# Patient Record
Sex: Female | Born: 1990 | Race: White | Hispanic: No | Marital: Single | Smoking: Current every day smoker
Health system: Southern US, Community
[De-identification: ages and names within clinical notes are randomized; demographics above are authoritative.]

## PROBLEM LIST (undated history)

## (undated) DIAGNOSIS — O0990 Supervision of high risk pregnancy, unspecified, unspecified trimester: Secondary | ICD-10-CM

## (undated) DIAGNOSIS — T8859XA Other complications of anesthesia, initial encounter: Secondary | ICD-10-CM

## (undated) DIAGNOSIS — R87619 Unspecified abnormal cytological findings in specimens from cervix uteri: Secondary | ICD-10-CM

## (undated) DIAGNOSIS — T4145XA Adverse effect of unspecified anesthetic, initial encounter: Secondary | ICD-10-CM

## (undated) HISTORY — PX: CHOLECYSTECTOMY: SHX55

## (undated) HISTORY — DX: Unspecified abnormal cytological findings in specimens from cervix uteri: R87.619

## (undated) HISTORY — DX: Supervision of high risk pregnancy, unspecified, unspecified trimester: O09.90

---
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
## 2010-01-05 ENCOUNTER — Emergency Department: Payer: Self-pay | Admitting: Emergency Medicine

## 2010-03-18 IMAGING — US ABDOMEN ULTRASOUND
1 series · 17 of 25 positions shown · non-contrast
Comparison: none

REASON FOR EXAM: ruq/epigastric pain
COMMENTS:

PROCEDURE:     US  - US ABDOMEN GENERAL SURVEY  - March 07, 2009  [DATE]
RESULT:     Abdominal ultrasound dated 03/07/2009.

[Series 1: abdomen ultrasound · 17 of 50 slices shown]
[im 1/50]
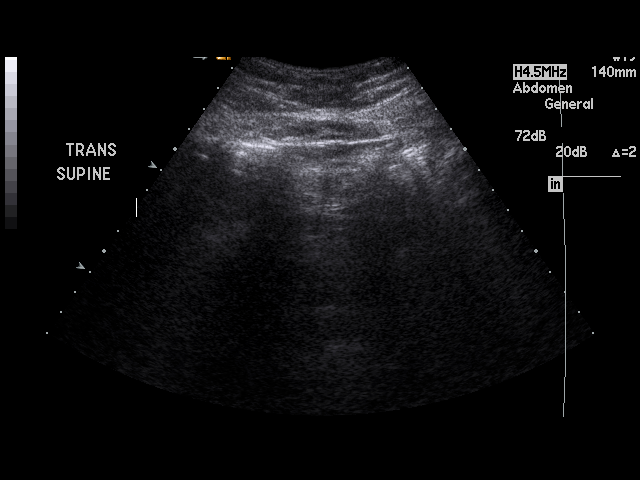
[im 5/50]
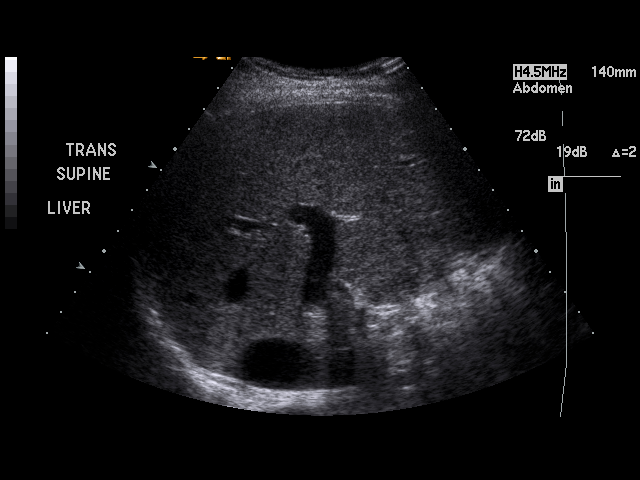
[im 7/50]
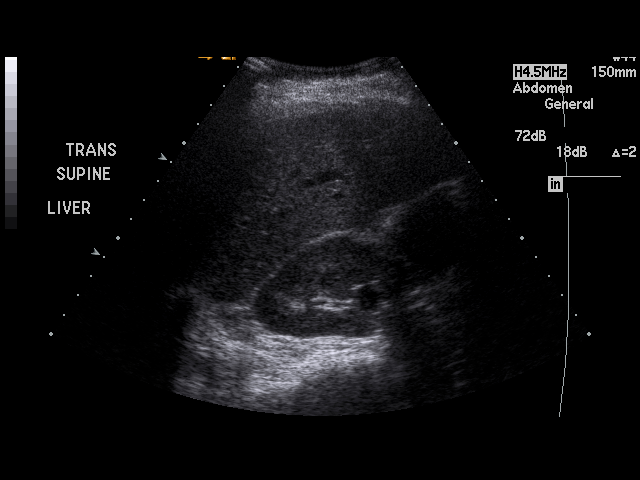
[im 11/50]
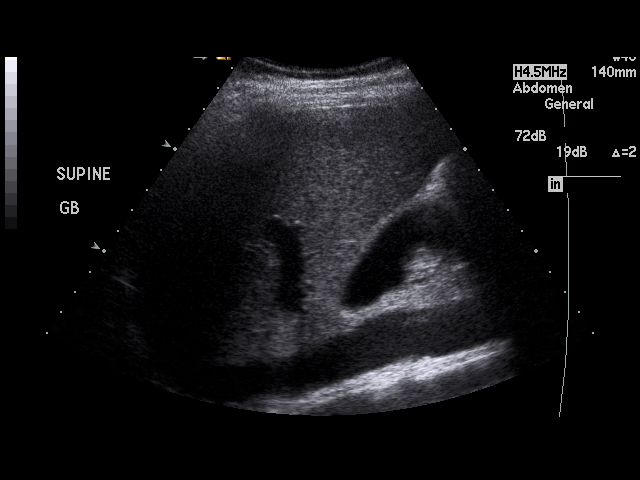
[im 13/50]
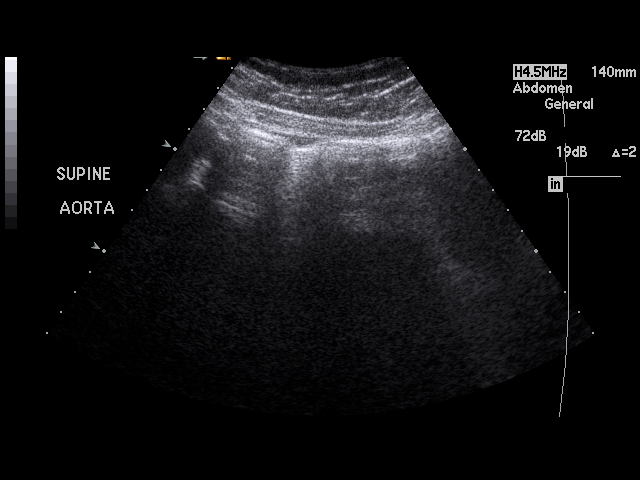
[im 17/50]
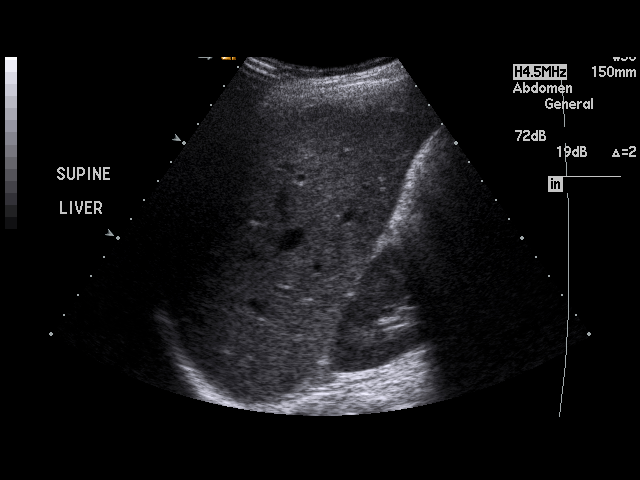
[im 19/50]
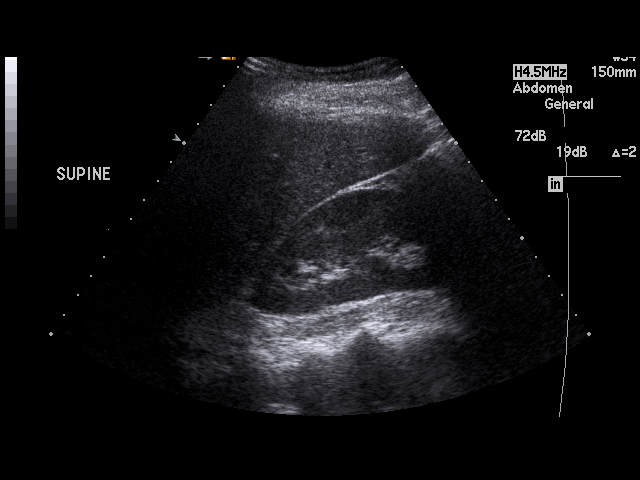
[im 23/50]
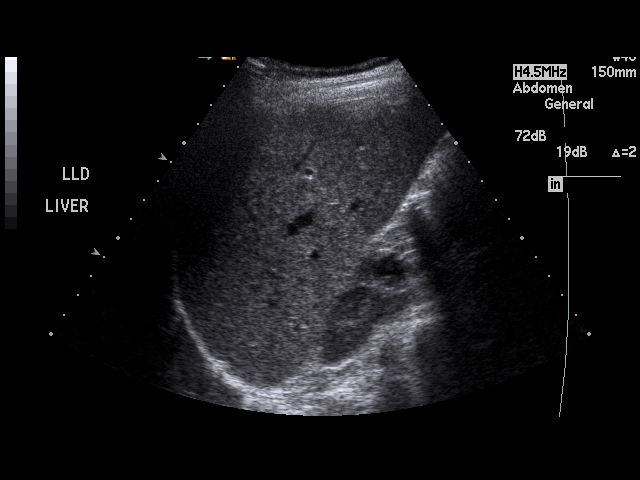
[im 25/50]
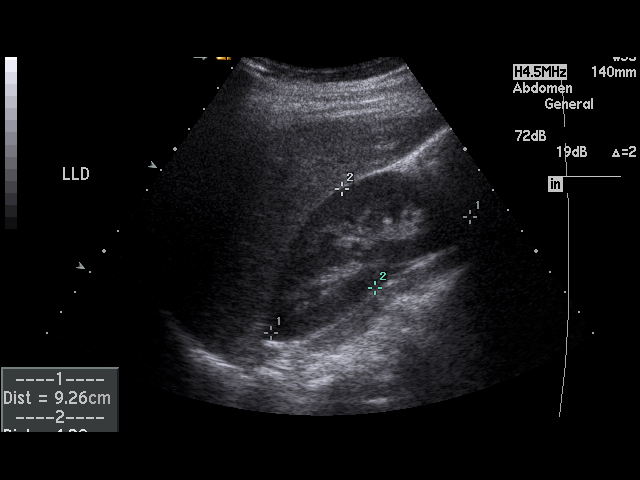
[im 27/50]
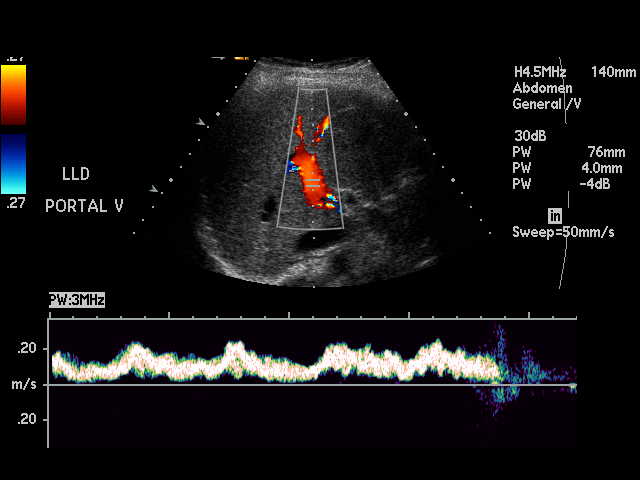
[im 31/50]
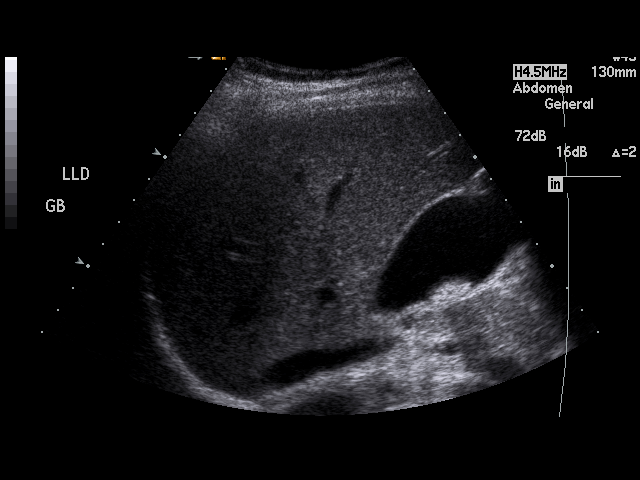
[im 33/50]
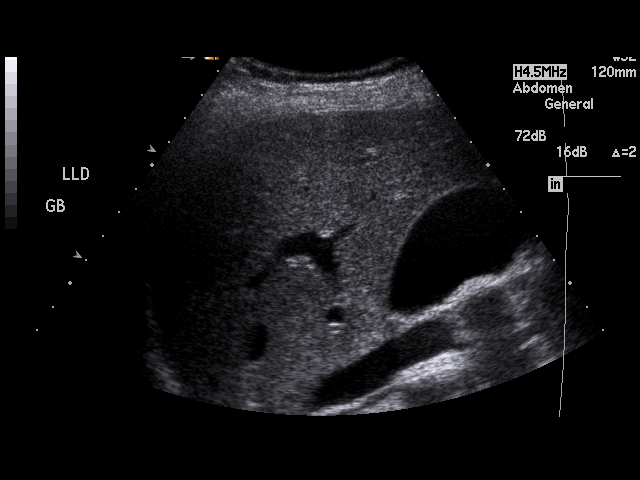
[im 37/50]
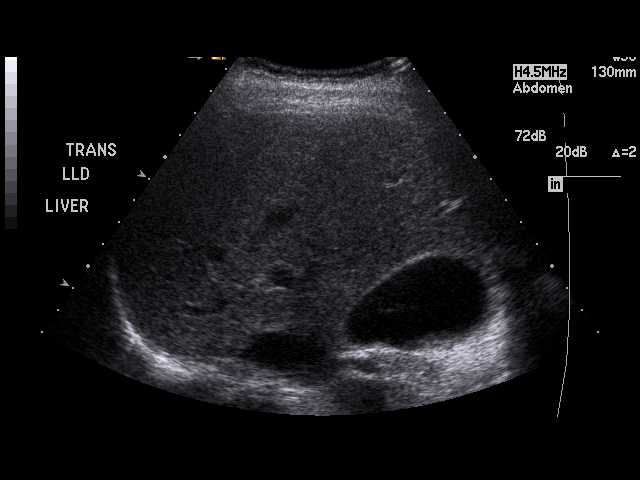
[im 39/50]
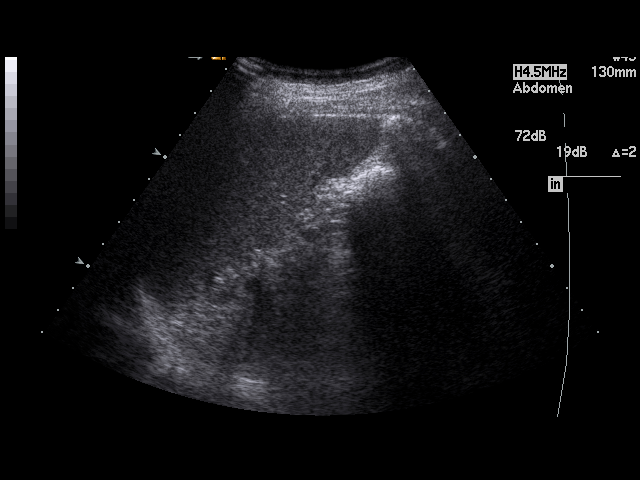
[im 43/50]
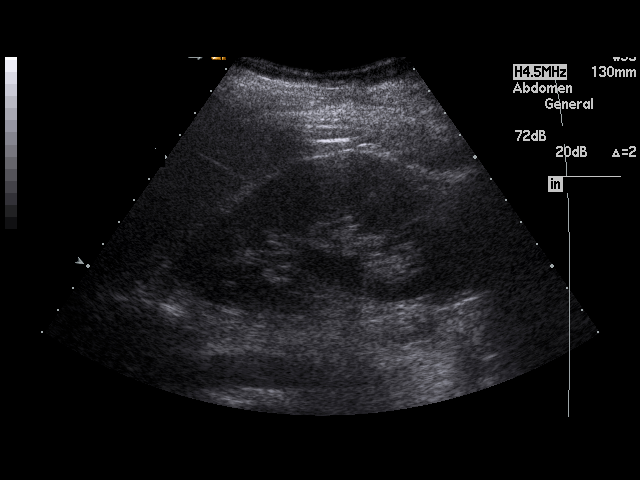
[im 45/50]
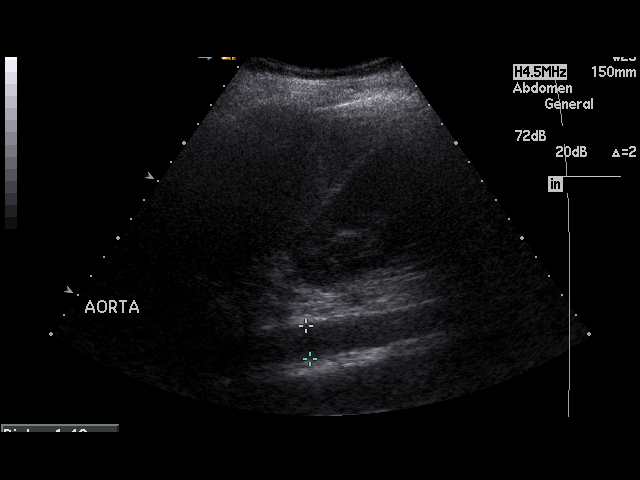
[im 50/50]
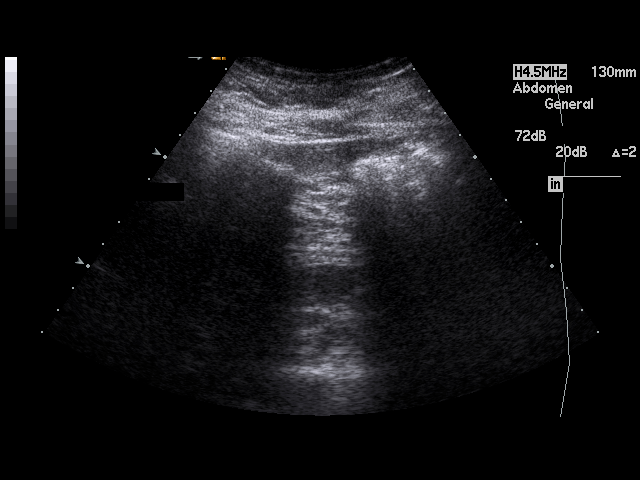

[17 of 25 positions shown; findings below may reference images not displayed]

FINDINGS: The liver demonstrates a homogeneous echotexture. Hepatopedal flow is
demonstrated within the portal vein. The pancreas is nonvisualized. The
aorta and IVC are unremarkable. Evaluation gallbladder fossa demonstrates a
mobile gallstone within the gallbladder without evidence of pericholecystic
fluid, a sonographic Murphy's sign, nor gallbladder wall thickening. The
gallbladder wall measures 1.5 mm in thickness the common bile duct 3.6 mm.
The right kidney measures 9.26 x 4.2 cm and the left 10.8 x 5.25 cm. There
is appropriate cortical medullary differentiation without evidence of
hydronephrosis masses nor calculi.
IMPRESSION: Mobile gallstone within the gallbladder otherwise
unremarkable abdominal ultrasound.

## 2010-11-30 ENCOUNTER — Encounter: Payer: Self-pay | Admitting: Obstetrics and Gynecology

## 2011-02-02 HISTORY — PX: FRACTURE SURGERY: SHX138

## 2011-03-14 ENCOUNTER — Inpatient Hospital Stay: Payer: Self-pay | Admitting: Obstetrics and Gynecology

## 2011-03-14 LAB — URINALYSIS, COMPLETE
Bilirubin,UR: NEGATIVE
Blood: NEGATIVE
Glucose,UR: NEGATIVE mg/dL (ref 0–75)
Ketone: NEGATIVE
WBC UR: 2 /HPF (ref 0–5)

## 2011-03-14 LAB — FETAL FIBRONECTIN: Appearance: NORMAL

## 2011-04-27 ENCOUNTER — Ambulatory Visit: Payer: Self-pay | Admitting: Obstetrics and Gynecology

## 2011-04-27 LAB — CBC WITH DIFFERENTIAL/PLATELET
Basophil %: 0.3 %
Eosinophil #: 0 10*3/uL (ref 0.0–0.7)
Eosinophil %: 0.1 %
HCT: 31.9 % — ABNORMAL LOW (ref 35.0–47.0)
HGB: 10.6 g/dL — ABNORMAL LOW (ref 12.0–16.0)
Lymphocyte #: 2.3 10*3/uL (ref 1.0–3.6)
Lymphocyte %: 21.2 %
MCH: 30.1 pg (ref 26.0–34.0)
MCHC: 33.1 g/dL (ref 32.0–36.0)
MCV: 91 fL (ref 80–100)
Monocyte %: 7.1 %
Neutrophil #: 7.7 10*3/uL — ABNORMAL HIGH (ref 1.4–6.5)
Neutrophil %: 71.3 %
RDW: 13.9 % (ref 11.5–14.5)

## 2011-04-28 ENCOUNTER — Inpatient Hospital Stay: Payer: Self-pay | Admitting: Obstetrics and Gynecology

## 2011-04-29 LAB — HEMATOCRIT: HCT: 30.5 % — ABNORMAL LOW (ref 35.0–47.0)

## 2011-12-09 ENCOUNTER — Ambulatory Visit: Payer: Self-pay | Admitting: Family Medicine

## 2011-12-14 ENCOUNTER — Ambulatory Visit: Payer: Self-pay | Admitting: Podiatry

## 2011-12-14 LAB — CBC WITH DIFFERENTIAL/PLATELET
Eosinophil %: 1.5 %
HCT: 35.8 % (ref 35.0–47.0)
HGB: 12 g/dL (ref 12.0–16.0)
Lymphocyte #: 2.4 10*3/uL (ref 1.0–3.6)
MCH: 31.1 pg (ref 26.0–34.0)
MCV: 93 fL (ref 80–100)
Monocyte #: 0.6 x10 3/mm (ref 0.2–0.9)
Monocyte %: 9.7 %
Neutrophil #: 3.2 10*3/uL (ref 1.4–6.5)
Platelet: 337 10*3/uL (ref 150–440)
RDW: 13.6 % (ref 11.5–14.5)
WBC: 6.3 10*3/uL (ref 3.6–11.0)

## 2011-12-17 ENCOUNTER — Ambulatory Visit: Payer: Self-pay | Admitting: Podiatry

## 2012-04-05 ENCOUNTER — Ambulatory Visit: Payer: Self-pay | Admitting: Family Medicine

## 2012-04-05 LAB — URINALYSIS, COMPLETE
Glucose,UR: NEGATIVE mg/dL (ref 0–75)
Ketone: NEGATIVE
Leukocyte Esterase: NEGATIVE
Nitrite: NEGATIVE
Ph: 6.5 (ref 4.5–8.0)

## 2012-04-05 LAB — PREGNANCY, URINE: Pregnancy Test, Urine: NEGATIVE m[IU]/mL

## 2012-09-05 ENCOUNTER — Ambulatory Visit: Payer: Self-pay | Admitting: Physician Assistant

## 2012-09-05 LAB — URINALYSIS, COMPLETE
Bilirubin,UR: NEGATIVE
Ph: 5 (ref 4.5–8.0)
Protein: 100
RBC,UR: >30 /HPF (ref 0–5)
Specific Gravity: 1.025 (ref 1.003–1.030)

## 2012-09-07 LAB — URINE CULTURE

## 2013-01-03 ENCOUNTER — Emergency Department: Payer: Self-pay | Admitting: Emergency Medicine

## 2014-05-21 NOTE — Op Note (Signed)
PATIENT NAME:  Wendy Bishop, [name] MR#:  [id number] DATE OF BIRTH:  [birth year]  DATE OF PROCEDURE:  12/17/2011  PREOPERATIVE DIAGNOSIS: Fracture, left fifth metatarsal.  POSTOPERATIVE DIAGNOSIS: Fracture, left fifth metatarsal.   PROCEDURE: ORIF left fifth metatarsal fracture.   SURGEON: Linus Galasodd Jillianna Stanek, DPM   ANESTHESIA: Local MAC.   HEMOSTASIS: Pneumatic tourniquet, left ankle, 250 mmHg.   ESTIMATED BLOOD LOSS: Minimal.   MATERIALS: Three 28-gauge monofilament wires double-braided.   COMPLICATIONS: None apparent.   OPERATIVE INDICATIONS: This is a 24 year old female with recent injury to her left foot where she fractured her fifth metatarsal. The patient elects to have surgical ORIF.   OPERATIVE PROCEDURE: The patient was placed on the table in the supine position. Following satisfactory sedation, the left foot was anesthetized with 10 mL of 0.5% Sensorcaine plain. Pneumatic tourniquet was applied at the level of the left ankle and the foot was prepped and draped in the usual sterile fashion. The foot was exsanguinated and the tourniquet inflated to 250 mmHg.   Attention was then directed to the dorsal lateral aspect of the left foot where an approximate 4 cm linear incision was made coursing proximal to distal over the fifth metatarsal. The incision was deepened via sharp and blunt dissection with care taken to cauterize all vessels. A linear periosteal incision was then made along the dorsal fifth metatarsal and using a freer elevator the periosteum was elevated off of the fracture site. The fracture site was then cleaned up using the freer elevator and some hemostats and the wound was flushed with copious amounts of sterile saline. The fracture was then reapproximated and held in place with an Allis clamp. Next, three 28-gauge monofilament wires double-braided back on itself were then inserted around the bone in a cerclage technique and fastened down. Intraoperative FluoroScan views revealed  good reduction of the fracture site and intraoperatively the edges were well coapted. The ends of the monofilament wire were then cut and the remaining end was pressed down against the bone. The wound was flushed with copious amounts of sterile saline and the incision was closed using 4-0 Vicryl running suture for all layers from periosteal deep and superficial subcutaneous to skin closure. Tincture of benzoin and Steri-Strips were then applied across the incision followed by 4 x 4's and a sterile bandage. The tourniquet was released and blood flow noted to return immediately to the left foot in digits one through five. A posterior splint of plaster was then applied to the left foot and leg with the foot 90 degrees relative to the leg. The patient tolerated the procedures and anesthesia well and was transported to the PAC-U with vital signs stable and in good condition.   ____________________________ Linus Galasodd Silviano Neuser, DPM tc:drc D: 12/17/2011 09:21:59 ET T: 12/17/2011 10:30:15 ET JOB#: [id number]  cc: Linus Galasodd Adylee Leonardo, DPM, <Dictator> Gean Larose DPM ELECTRONICALLY SIGNED 12/20/2011 8:57

## 2014-05-26 NOTE — Op Note (Signed)
PATIENT NAME:  Wendy Bishop, Wendy Bishop MR#:  [id number] DATE OF BIRTH:  [birth year]  DATE OF PROCEDURE:  04/28/2011  PREOPERATIVE DIAGNOSIS: Cesarean section prior.   POSTOPERATIVE DIAGNOSIS: Cesarean section prior.   PROCEDURE PERFORMED: Repeat low transverse cesarean section.   SURGEON: Avyana Puffenbarger A. Patton SallesWeaver-Lee, MD  ASSISTANT: Cathlean CowerJamie W., scrub tech.   ANESTHESIA: Spinal.   ESTIMATED BLOOD LOSS: 100 mL.   COMPLICATIONS: None.   FINDINGS: Vertex female infant, 3010 grams, Apgars 9 and 9, adhesions of anterior uterus to peritoneum, very thin lower uterine segment, normal tubes and ovaries.   SPECIMENS: None.   INDICATIONS: Patient is a 24 year old who presents for repeat cesarean section. Risks, benefits and alternatives of the procedure were explained and informed consent was obtained.   DESCRIPTION OF PROCEDURE: Patient was taken to the Operating Room with IV fluids running. She was prepped and draped in the usual sterile fashion in leftward tilt. A Pfannenstiel skin incision was made, carried down to underlying fascia with the knife. The fascia was nicked in the midline. The incision was extended laterally. The superior aspect of the fascia was grasped with Kocher clamps and the underlying rectus muscles were dissected off. This was repeated on the inferior fascia. The rectus muscles were divided in the midline. The peritoneum was entered sharply and the opening was extended. The bladder blade was placed. The vesicouterine peritoneum was located with some difficulty secondary to prior adhesions. The bladder flap was created digitally. The bladder blade was replaced. The hysterotomy incision was made with just two swipes blade as the lower uterine segment was very thin. The incision was extended bluntly. The infant's head was grasped. The membranes were ruptured and the infant's head was delivered atraumatically through the hysterotomy incision. The mouth and nose were bulb suctioned. The anterior and  posterior shoulder were delivered followed by remainder of the body. The cord was clamped x2 and cut. The infant was handed to the awaiting neonatologist. The placenta was expressed. The uterus was partly exteriorized secondary to adhesions of the left anterior uterus to the peritoneum and cleared of all clot and debris. The hysterotomy incision was repaired with #0 Monocryl in running locked fashion. The uterus was returned to the abdomen. The abdomen and gutters were irrigated with copious amounts of warm normal saline. A piece Interceed was placed over the hysterotomy incision. The peritoneum was repaired with 2-0 Vicryl in a running fashion. The On-Q pump was placed according to manufacturer's instructions. The fascia was repaired with a #1 PDS. The skin was closed with staples. Patient tolerated procedure well. The On-Q pump tubing was secured using Steri-Strips and Tegaderm. Sponge, needle and instrument counts were correct x2. The patient was taken to the recovery room in stable condition.    ____________________________ Sonda PrimesLashawn A. Patton SallesWeaver-Lee, MD law:cms D: 04/28/2011 09:24:27 ET T: 04/28/2011 11:03:30 ET JOB#: [id number]  cc: Flint MelterLashawn A. Patton SallesWeaver-Lee, MD, <Dictator> Janyth ContesLASHAWN A WEAVER LEE MD ELECTRONICALLY SIGNED 05/15/2011 10:34

## 2014-06-11 NOTE — H&P (Signed)
L&D Evaluation:  History:   HPI 24 yo G3P1101 @ 3283w0d by 15 week u/s.  Pregnancy complicated by history of delivery at 24 weeks secondary to suspected placental abruption. There was subsequent fetal demise.  For G2 she went on to deliver at term, but by cesarean section due to fetal distress. Her pregnancy has also been complicated by smoking and an abnormal pap smear. She presents with abdominal pain and lower back pain that is constant.  This happened after an episode of exertion about 1.5 hours prior to presentation.  She notes +FM, no LOF, no VB.  A+, RI, VZI, RPR NR, HIV neg.    Presents with abdominal pain, back pain, contractions    Patient's Medical History No Chronic Illness    Patient's Surgical History Previous C-Section  Cholecystectomy    Medications Pre Natal Vitamins    Allergies NKDA    Social History tobacco    Family History Non-Contributory   ROS:   ROS All systems were reviewed.  HEENT, CNS, GI, GU, Respiratory, CV, Renal and Musculoskeletal systems were found to be normal., Denies urinary symptoms, denies vaginal symptoms   Exam:   Vital Signs stable  AFVSS    General no apparent distress    Mental Status clear    Chest clear    Heart normal sinus rhythm    Abdomen gravid, mildly tender to palpation in midline, mild ttp over right aspect of previous pfannenstiel incision    Estimated Fetal Weight Average for gestational age, 2129g = 4#11oz    Fetal Position cephalic    Back no CVAT    Edema no edema    Pelvic no external lesions, 1/20/-3    Mebranes Intact    FHT normal rate with no decels    FHT Description 135/mod var/+accels/no decels    Ucx regular, 3-4 ctx q 10 mins    Ucx Frequency 3 min    Skin no lesions    Lymph no lymphadenopathy    Other Wet Mount: neg KOH: no fungal elements fFN = negative   Impression:   Impression eval for labor   Plan:   Plan UA, EFM/NST, monitor contractions and for cervical change, fluids     Comments fFN negative send Aptima send UA admit and give BMTZ and will tocolyze with nifedipine if labors, will go to OR for repeat cesarean section.   Electronic Signatures: Conard NovakJackson, Jaide Hillenburg D (MD)  (Signed 10-Feb-13 20:58)  Authored: L&D Evaluation   Last Updated: 10-Feb-13 20:58 by Conard NovakJackson, Jaasia Viglione D (MD)

## 2015-09-30 ENCOUNTER — Encounter: Payer: Self-pay | Admitting: Emergency Medicine

## 2015-09-30 ENCOUNTER — Emergency Department
Admission: EM | Admit: 2015-09-30 | Discharge: 2015-09-30 | Disposition: A | Discharge status: Home / Self Care | Payer: Medicaid Other | Attending: Emergency Medicine | Admitting: Emergency Medicine

## 2015-09-30 DIAGNOSIS — Z3A08 8 weeks gestation of pregnancy: Secondary | ICD-10-CM | POA: Diagnosis not present

## 2015-09-30 DIAGNOSIS — Y999 Unspecified external cause status: Secondary | ICD-10-CM | POA: Diagnosis not present

## 2015-09-30 DIAGNOSIS — Z3401 Encounter for supervision of normal first pregnancy, first trimester: Secondary | ICD-10-CM | POA: Insufficient documentation

## 2015-09-30 DIAGNOSIS — F17200 Nicotine dependence, unspecified, uncomplicated: Secondary | ICD-10-CM | POA: Insufficient documentation

## 2015-09-30 DIAGNOSIS — Y9389 Activity, other specified: Secondary | ICD-10-CM | POA: Diagnosis not present

## 2015-09-30 DIAGNOSIS — Y929 Unspecified place or not applicable: Secondary | ICD-10-CM | POA: Insufficient documentation
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
## 2015-09-30 NOTE — Discharge Instructions (Signed)
Return to the ER for difficulty breathing, abdominal pain, vaginal bleeding, pelvic pain, numbness or weakness in her arms or legs, or for any other concerns.
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
## 2015-10-10 LAB — OB RESULTS CONSOLE ABO/RH: RH TYPE: POSITIVE

## 2015-10-10 LAB — OB RESULTS CONSOLE RPR: RPR: NONREACTIVE

## 2015-10-10 LAB — OB RESULTS CONSOLE ANTIBODY SCREEN: Antibody Screen: NEGATIVE

## 2015-10-10 LAB — OB RESULTS CONSOLE HEPATITIS B SURFACE ANTIGEN: Hepatitis B Surface Ag: NEGATIVE

## 2015-10-10 LAB — OB RESULTS CONSOLE HGB/HCT, BLOOD
HEMATOCRIT: 38 %
Hemoglobin: 13.2 g/dL

## 2015-10-10 LAB — OB RESULTS CONSOLE PLATELET COUNT: PLATELETS: 392 10*3/uL

## 2015-10-10 LAB — OB RESULTS CONSOLE RUBELLA ANTIBODY, IGM: RUBELLA: IMMUNE

## 2015-10-10 LAB — OB RESULTS CONSOLE HIV ANTIBODY (ROUTINE TESTING): HIV: NONREACTIVE

## 2015-10-10 LAB — OB RESULTS CONSOLE VARICELLA ZOSTER ANTIBODY, IGG: Varicella: IMMUNE

## 2015-10-10 LAB — OB RESULTS CONSOLE GC/CHLAMYDIA
CHLAMYDIA, DNA PROBE: NEGATIVE
Gonorrhea: NEGATIVE

## 2015-11-10 ENCOUNTER — Emergency Department
Admission: EM | Admit: 2015-11-10 | Discharge: 2015-11-10 | Disposition: A | Discharge status: Home / Self Care | Payer: Medicaid Other | Attending: Emergency Medicine | Admitting: Emergency Medicine

## 2015-11-10 ENCOUNTER — Emergency Department: Payer: Medicaid Other

## 2015-11-10 ENCOUNTER — Encounter: Payer: Self-pay | Admitting: Emergency Medicine

## 2015-11-10 DIAGNOSIS — N9489 Other specified conditions associated with female genital organs and menstrual cycle: Secondary | ICD-10-CM | POA: Diagnosis not present

## 2015-11-10 DIAGNOSIS — Z79899 Other long term (current) drug therapy: Secondary | ICD-10-CM | POA: Diagnosis not present

## 2015-11-10 DIAGNOSIS — O209 Hemorrhage in early pregnancy, unspecified: Secondary | ICD-10-CM | POA: Diagnosis not present

## 2015-11-10 DIAGNOSIS — O4690 Antepartum hemorrhage, unspecified, unspecified trimester: Secondary | ICD-10-CM

## 2015-11-10 DIAGNOSIS — F17200 Nicotine dependence, unspecified, uncomplicated: Secondary | ICD-10-CM | POA: Insufficient documentation

## 2015-11-10 DIAGNOSIS — Z3A13 13 weeks gestation of pregnancy: Secondary | ICD-10-CM | POA: Insufficient documentation

## 2015-11-10 DIAGNOSIS — O99331 Smoking (tobacco) complicating pregnancy, first trimester: Secondary | ICD-10-CM | POA: Insufficient documentation

## 2015-11-10 LAB — CBC WITH DIFFERENTIAL/PLATELET
BASOS ABS: 0 10*3/uL (ref 0–0.1)
BLASTS: 0 %
Band Neutrophils: 0 %
Basophils Relative: 0 %
Eosinophils Absolute: 0.3 10*3/uL (ref 0–0.7)
Eosinophils Relative: 3 %
HEMATOCRIT: 35 % (ref 35.0–47.0)
Hemoglobin: 12.4 g/dL (ref 12.0–16.0)
Lymphocytes Relative: 24 %
Lymphs Abs: 2.4 10*3/uL (ref 1.0–3.6)
MCH: 31.8 pg (ref 26.0–34.0)
MCHC: 35.5 g/dL (ref 32.0–36.0)
MCV: 89.7 fL (ref 80.0–100.0)
METAMYELOCYTES PCT: 0 %
MONOS PCT: 4 %
MYELOCYTES: 0 %
Monocytes Absolute: 0.4 10*3/uL (ref 0.2–0.9)
NEUTROS ABS: 6.8 10*3/uL — AB (ref 1.4–6.5)
NEUTROS PCT: 69 %
Other: 0 %
Platelets: 317 10*3/uL (ref 150–440)
Promyelocytes Absolute: 0 %
RBC: 3.9 MIL/uL (ref 3.80–5.20)
RDW: 13.2 % (ref 11.5–14.5)
WBC: 9.9 10*3/uL (ref 3.6–11.0)
nRBC: 0 /100 WBC

## 2015-11-10 LAB — POCT PREGNANCY, URINE: Preg Test, Ur: POSITIVE — AB

## 2015-11-10 LAB — HCG, QUANTITATIVE, PREGNANCY: hCG, Beta Chain, Quant, S: 103737 m[IU]/mL — ABNORMAL HIGH (ref <5)

## 2015-11-10 NOTE — ED Notes (Signed)
Patient transported to US 

## 2015-11-10 NOTE — ED Provider Notes (Signed)
Time Seen: Approximately 0741  I have reviewed the triage notes  Chief Complaint: Vaginal Bleeding   History of Present Illness: Wendy Bishop is a 25 y.o. female *who is currently gravida 4 para 2 with one previous 24 week stillbirth. The patient states that she also had a cholecystectomy around her second pregnancy. She states she is currently approximately [redacted] weeks pregnant with no complications up to this point. She's had some morning sickness and at times hyperemesis gravidarum. She states that seems to be resolving. Patient has an appointment today at noon with her OB/GYN but was concerned this morning due to some small amount of vaginal bleeding. She states she's had a previous ultrasound approximately 2 weeks ago and everything was proceeding normally at that time. She denies any fever or urinary complaints. She denies any severe cramping or pain in the pelvic area. History reviewed. No pertinent past medical history.  There are no active problems to display for this patient.   History reviewed. No pertinent surgical history.  History reviewed. No pertinent surgical history.    Allergies:  Review of patient's allergies indicates no known allergies.  Family History: No family history on file.  Social History: Social History  Substance Use Topics  . Smoking status: Current Every Day Smoker  . Smokeless tobacco: Never Used  . Alcohol use No     Review of Systems:   10 point review of systems was performed and was otherwise negative:  Constitutional: No fever Eyes: No visual disturbances ENT: No sore throat, ear pain Cardiac: No chest pain Respiratory: No shortness of breath, wheezing, or stridor Abdomen: No abdominal pain, no vomiting, No diarrhea Endocrine: No weight loss, No night sweats Extremities: No peripheral edema, cyanosis Skin: No rashes, easy bruising Neurologic: No focal weakness, trouble with speech or swollowing Urologic: No dysuria,  Hematuria, or urinary frequency   Physical Exam:  ED Triage Vitals  Enc Vitals Group     BP 11/10/15 0722 (!) 118/57     Pulse Rate 11/10/15 0722 83     Resp 11/10/15 0722 18     Temp 11/10/15 0722 98.1 F (36.7 C)     Temp Source 11/10/15 0722 Oral     SpO2 11/10/15 0722 100 %     Weight 11/10/15 0717 194 lb (88 kg)     Height --      Head Circumference --      Peak Flow --      Pain Score --      Pain Loc --      Pain Edu? --      Excl. in GC? --     General: Awake , Alert , and Oriented times 3; GCS 15 Head: Normal cephalic , atraumatic Eyes: Pupils equal , round, reactive to light Nose/Throat: No nasal drainage, patent upper airway without erythema or exudate.  Neck: Supple, Full range of motion, No anterior adenopathy or palpable thyroid masses Lungs: Clear to ascultation without wheezes , rhonchi, or rales Heart: Regular rate, regular rhythm without murmurs , gallops , or rubs Abdomen: Soft, non tender without rebound, guarding , or rigidity; bowel sounds positive and symmetric in all 4 quadrants. No organomegaly .        Extremities: 2 plus symmetric pulses. No edema, clubbing or cyanosis Neurologic: normal ambulation, Motor symmetric without deficits, sensory intact Skin: warm, dry, no rashes Pelvic exam was deferred for ultrasound evaluation  Labs:   All laboratory work was reviewed including any pertinent  negatives or positives listed below:  Labs Reviewed  CBC WITH DIFFERENTIAL/PLATELET  HCG, QUANTITATIVE, PREGNANCY  Laboratory work was reviewed and showed no clinically significant abnormalities.   Radiology:  "US Ob Comp Less 14 Wks  Result Date: 11/10/2015 CLINICAL DATA:  25 year old female [place] with vaginal bleeding in the first trimester of pregnancy. Estimated gestational age by a first ultrasound 13 weeks 3 days. Quantitative beta HCG pending. Initial encounter. EXAM: OBSTETRIC <14 WK ULTRASOUND TECHNIQUE: Transabdominal ultrasound was performed for  evaluation of the gestation as well as the maternal uterus and adnexal regions. COMPARISON:  None relevant, 11/30/2010 Ob ultrasound FINDINGS: Intrauterine gestational sac: Single Yolk sac:  Not visible Embryo:  Visible Cardiac Activity: Detected Heart Rate: 153 bpm CRL:   74.8  mm   13 w 4 d                  [place] EDC: 05/13/2016 Subchorionic hemorrhage:  None visualized. Maternal uterus/adnexae: The left ovary appears normal measuring 1.7 x 2.4 x 3.3 cm. The right ovary is not visualized. No pelvic free fluid. IMPRESSION: Single living IUP demonstrated. No acute maternal findings visualized. Electronically Signed   By: Odessa Fleming M.D.   On: 11/10/2015 08:46  " I personally reviewed the radiologic studies   ED Course:  Patient's stay was uneventful and appears that her pregnancy is proceeding normally. She was advised to continue with her follow-up with her OB/GYN and informed them of her visit and laboratory work and ultrasound etc. Clinical Course     Assessment: First trimester vaginal bleeding   F   Plan: * Outpatient Patient was advised to return immediately if condition worsens. Patient was advised to follow up with their primary care physician or other specialized physicians involved in their outpatient care. The patient and/or family member/power of attorney had laboratory results reviewed at the bedside. All questions and concerns were addressed and appropriate discharge instructions were distributed by the nursing staff.             Jennye Moccasin, MD 11/10/15 [phone]

## 2015-11-10 NOTE — ED Triage Notes (Signed)
Pt with vaginal bleeding, states she is three mths pregnant.

## 2015-11-10 NOTE — ED Notes (Signed)
Pt stating that she is about 3 and a half months pregnant. Pt stating that she noticed some pinkish blood when she wiped after urination. Pt denying any abdominal pain. Pt is stating that she has an appointment with her OB/GYN today at noon. Pt denying any other blood or spotting since the initial spot with cleaning herself earlier.

## 2015-11-10 NOTE — ED Notes (Signed)
US called with positive pregnancy. US aware that pt is ready for test.

## 2015-11-10 NOTE — Discharge Instructions (Signed)
Please return immediately if condition worsens. Please contact her primary physician or the physician you were given for referral. If you have any specialist physicians involved in her treatment and plan please also contact them. Thank you for using [facility]. ° °

## 2015-11-10 NOTE — ED Notes (Signed)
Pt returned from US

## 2016-03-31 ENCOUNTER — Telehealth: Payer: Self-pay | Admitting: Obstetrics and Gynecology

## 2016-03-31 NOTE — Telephone Encounter (Signed)
Pt is calling due to missed call and needing an update  on surgical referral in [place]

## 2016-04-05 ENCOUNTER — Ambulatory Visit (INDEPENDENT_AMBULATORY_CARE_PROVIDER_SITE_OTHER): Payer: Medicaid Other | Admitting: Certified Nurse Midwife

## 2016-04-05 VITALS — BP 102/62 | Wt 186.0 lb

## 2016-04-05 DIAGNOSIS — O0993 Supervision of high risk pregnancy, unspecified, third trimester: Secondary | ICD-10-CM

## 2016-04-05 DIAGNOSIS — O09212 Supervision of pregnancy with history of pre-term labor, second trimester: Secondary | ICD-10-CM

## 2016-04-05 DIAGNOSIS — Z720 Tobacco use: Secondary | ICD-10-CM

## 2016-04-05 DIAGNOSIS — Z3A34 34 weeks gestation of pregnancy: Secondary | ICD-10-CM

## 2016-04-05 DIAGNOSIS — O34219 Maternal care for unspecified type scar from previous cesarean delivery: Secondary | ICD-10-CM

## 2016-04-05 NOTE — Progress Notes (Signed)
Pt reports no problems. FKC's given.  

## 2016-04-06 ENCOUNTER — Other Ambulatory Visit: Payer: Self-pay | Admitting: Obstetrics & Gynecology

## 2016-04-06 DIAGNOSIS — Z8751 Personal history of pre-term labor: Secondary | ICD-10-CM

## 2016-04-07 DIAGNOSIS — Z720 Tobacco use: Secondary | ICD-10-CM | POA: Insufficient documentation

## 2016-04-07 DIAGNOSIS — F17200 Nicotine dependence, unspecified, uncomplicated: Secondary | ICD-10-CM | POA: Insufficient documentation

## 2016-04-07 DIAGNOSIS — O0993 Supervision of high risk pregnancy, unspecified, third trimester: Secondary | ICD-10-CM | POA: Insufficient documentation

## 2016-04-07 DIAGNOSIS — O34219 Maternal care for unspecified type scar from previous cesarean delivery: Secondary | ICD-10-CM | POA: Insufficient documentation

## 2016-04-07 DIAGNOSIS — O09212 Supervision of pregnancy with history of pre-term labor, second trimester: Secondary | ICD-10-CM | POA: Insufficient documentation

## 2016-04-07 NOTE — Progress Notes (Signed)
Doing well  active. FHTs WNL.  Will be bottle feeding/ BTL ( papers signed) Repeat CS and BTL scheduled for 05/11/2016 Still smoking 1/2 PPD. Given FKC instructions ROB and GBS in 2 weeks

## 2016-04-08 ENCOUNTER — Telehealth: Payer: Self-pay

## 2016-04-08 NOTE — Telephone Encounter (Signed)
Pt would like to know what to do about low abdominal pressure that she is experiencing per chart she is high risk pregnancy.

## 2016-04-08 NOTE — Telephone Encounter (Signed)
(  Pt 7225w6d, history of 24 week delivery)  Pt states the vaginal pressure has eased up since she has been sitting down some states she is about to get off work. Pt aware the pressure may have been from being on her feet for so long since the pressure has gotten better since resting. Please advise, if I should let her know anything else.

## 2016-04-08 NOTE — Telephone Encounter (Signed)
Left voice message for pt to let us know if she wanted to be seen sooner than her next appt.

## 2016-04-08 NOTE — Telephone Encounter (Signed)
Can check her next visit, and can see her sooner if necessary as a work in with any OB provider (not ABC) or overbooking one of us

## 2016-04-13 ENCOUNTER — Telehealth: Payer: Self-pay

## 2016-04-13 DIAGNOSIS — Z20828 Contact with and (suspected) exposure to other viral communicable diseases: Secondary | ICD-10-CM

## 2016-04-13 MED ORDER — OSELTAMIVIR PHOSPHATE 75 MG PO CAPS: 75.0000 mg | ORAL_CAPSULE | Freq: Every day | ORAL | 0 refills | Status: AC

## 2016-04-13 NOTE — Telephone Encounter (Signed)
Please advise 

## 2016-04-13 NOTE — Telephone Encounter (Signed)
Pt states her daughter was diagnosed with the flu today and she is requesting rx for tamiflu. Cb# [phone]. Thank you.  Please make sure SDJ receives message for today.

## 2016-04-14 NOTE — Telephone Encounter (Signed)
rx sent to pharmacy by SDJ on 04/13/16.

## 2016-04-20 ENCOUNTER — Encounter: Payer: Self-pay | Admitting: Advanced Practice Midwife

## 2016-04-20 ENCOUNTER — Ambulatory Visit (INDEPENDENT_AMBULATORY_CARE_PROVIDER_SITE_OTHER): Payer: Medicaid Other

## 2016-04-20 ENCOUNTER — Ambulatory Visit (INDEPENDENT_AMBULATORY_CARE_PROVIDER_SITE_OTHER): Payer: Medicaid Other | Admitting: Advanced Practice Midwife

## 2016-04-20 VITALS — BP 110/70 | Wt 188.0 lb

## 2016-04-20 DIAGNOSIS — Z3685 Encounter for antenatal screening for Streptococcus B: Secondary | ICD-10-CM

## 2016-04-20 DIAGNOSIS — Z113 Encounter for screening for infections with a predominantly sexual mode of transmission: Secondary | ICD-10-CM

## 2016-04-20 DIAGNOSIS — Z3A36 36 weeks gestation of pregnancy: Secondary | ICD-10-CM

## 2016-04-20 DIAGNOSIS — Z8751 Personal history of pre-term labor: Secondary | ICD-10-CM | POA: Diagnosis not present

## 2016-04-20 NOTE — Progress Notes (Signed)
Growth scan today. Baby at 6 pounds 4 ounces, 40%, AFI 12cm. Requests out of work letter today. Repeat C/S scheduled.

## 2016-04-20 NOTE — Progress Notes (Signed)
GBS/Aptima today 

## 2016-04-22 LAB — GC/CHLAMYDIA PROBE AMP
CHLAMYDIA, DNA PROBE: NEGATIVE
NEISSERIA GONORRHOEAE BY PCR: NEGATIVE

## 2016-04-22 LAB — STREP GP B NAA: STREP GROUP B AG: NEGATIVE

## 2016-04-27 ENCOUNTER — Encounter: Payer: Medicaid Other | Admitting: Obstetrics and Gynecology

## 2016-04-28 ENCOUNTER — Ambulatory Visit (INDEPENDENT_AMBULATORY_CARE_PROVIDER_SITE_OTHER): Payer: Medicaid Other | Admitting: Certified Nurse Midwife

## 2016-04-28 VITALS — BP 102/62 | Wt 193.0 lb

## 2016-04-28 DIAGNOSIS — Z3A37 37 weeks gestation of pregnancy: Secondary | ICD-10-CM

## 2016-04-28 DIAGNOSIS — O0993 Supervision of high risk pregnancy, unspecified, third trimester: Secondary | ICD-10-CM

## 2016-04-28 DIAGNOSIS — O34219 Maternal care for unspecified type scar from previous cesarean delivery: Secondary | ICD-10-CM

## 2016-04-28 NOTE — Progress Notes (Signed)
Pt reports no problems. Will be bottle feeding.Desires BTL with repeat Cesarean section 4/10 Labor precautions. ROB 1 week.

## 2016-05-07 ENCOUNTER — Ambulatory Visit (INDEPENDENT_AMBULATORY_CARE_PROVIDER_SITE_OTHER): Payer: Medicaid Other | Admitting: Obstetrics and Gynecology

## 2016-05-07 VITALS — BP 110/70 | Wt 190.0 lb

## 2016-05-07 DIAGNOSIS — O0993 Supervision of high risk pregnancy, unspecified, third trimester: Secondary | ICD-10-CM

## 2016-05-07 DIAGNOSIS — Z720 Tobacco use: Secondary | ICD-10-CM

## 2016-05-07 DIAGNOSIS — O34219 Maternal care for unspecified type scar from previous cesarean delivery: Secondary | ICD-10-CM

## 2016-05-07 DIAGNOSIS — Z3A39 39 weeks gestation of pregnancy: Secondary | ICD-10-CM

## 2016-05-07 DIAGNOSIS — O09212 Supervision of pregnancy with history of pre-term labor, second trimester: Secondary | ICD-10-CM

## 2016-05-07 NOTE — Progress Notes (Signed)
No vb. No lof. H&P in 3 days.  Precautions for labor, vaginal bleeding, ROM, and decreased fetal movement given.

## 2016-05-10 ENCOUNTER — Ambulatory Visit (INDEPENDENT_AMBULATORY_CARE_PROVIDER_SITE_OTHER): Payer: Medicaid Other | Admitting: Obstetrics and Gynecology

## 2016-05-10 ENCOUNTER — Encounter
Admission: RE | Admit: 2016-05-10 | Discharge: 2016-05-10 | Disposition: A | Discharge status: Home / Self Care | Payer: Medicaid Other | Source: Ambulatory Visit | Attending: Obstetrics and Gynecology | Admitting: Obstetrics and Gynecology

## 2016-05-10 VITALS — BP 112/74 | Wt 192.0 lb

## 2016-05-10 DIAGNOSIS — Z720 Tobacco use: Secondary | ICD-10-CM

## 2016-05-10 DIAGNOSIS — O34219 Maternal care for unspecified type scar from previous cesarean delivery: Secondary | ICD-10-CM

## 2016-05-10 DIAGNOSIS — Z01812 Encounter for preprocedural laboratory examination: Secondary | ICD-10-CM

## 2016-05-10 DIAGNOSIS — Z0183 Encounter for blood typing: Secondary | ICD-10-CM

## 2016-05-10 DIAGNOSIS — Z302 Encounter for sterilization: Secondary | ICD-10-CM

## 2016-05-10 DIAGNOSIS — O09212 Supervision of pregnancy with history of pre-term labor, second trimester: Secondary | ICD-10-CM

## 2016-05-10 DIAGNOSIS — Z3A39 39 weeks gestation of pregnancy: Secondary | ICD-10-CM

## 2016-05-10 DIAGNOSIS — O0993 Supervision of high risk pregnancy, unspecified, third trimester: Secondary | ICD-10-CM

## 2016-05-10 HISTORY — DX: Other complications of anesthesia, initial encounter: T88.59XA

## 2016-05-10 HISTORY — DX: Adverse effect of unspecified anesthetic, initial encounter: T41.45XA

## 2016-05-10 LAB — CBC
HCT: 33.1 % — ABNORMAL LOW (ref 35.0–47.0)
Hemoglobin: 11.3 g/dL — ABNORMAL LOW (ref 12.0–16.0)
MCH: 31.6 pg (ref 26.0–34.0)
MCHC: 34 g/dL (ref 32.0–36.0)
MCV: 92.8 fL (ref 80.0–100.0)
Platelets: 446 10*3/uL — ABNORMAL HIGH (ref 150–440)
RBC: 3.56 MIL/uL — ABNORMAL LOW (ref 3.80–5.20)
RDW: 14.1 % (ref 11.5–14.5)
WBC: 13 10*3/uL — ABNORMAL HIGH (ref 3.6–11.0)

## 2016-05-10 LAB — TYPE AND SCREEN
ABO/RH(D): A POS
Antibody Screen: NEGATIVE
EXTEND SAMPLE REASON: UNDETERMINED

## 2016-05-10 LAB — RAPID HIV SCREEN (HIV 1/2 AB+AG)
HIV 1/2 Antibodies: NONREACTIVE
HIV-1 P24 Antigen - HIV24: NONREACTIVE

## 2016-05-10 MED ORDER — BUPIVACAINE HCL (PF) 0.5 % IJ SOLN: 5.0000 mL | Freq: Once | INTRAMUSCULAR | Status: DC

## 2016-05-10 MED ORDER — BUPIVACAINE 0.25 % ON-Q PUMP DUAL CATH 400 ML
400.0000 mL | INJECTION | Status: DC
  Filled 2016-05-10: qty 400

## 2016-05-10 MED ORDER — CEFAZOLIN SODIUM-DEXTROSE 2-4 GM/100ML-% IV SOLN
2.0000 g | INTRAVENOUS | Status: DC
  Filled 2016-05-10 (×2): qty 100

## 2016-05-10 NOTE — Progress Notes (Signed)
Preoperative History and Physical  Wendy Bishop is a 26 y.o. [phone] here for preoperative appointment for cesarean section.   No significant preoperative concerns.  History of Present Illness: Prior cesarean section x 2 and desires bilateral tubal ligation.  Notes +FM, no LOF, no vaginal bleeding and no contractions.  Pregnancy complicated by obesity with BMI 32.  Overall weight gain this pregnancy was a weight loss of 2 pounds.  Also, mild anemia at 28 weeks with hematocrit 31.7.  She also is a smoker.   Proposed surgery: Repeat Cesarean Section with bilateral tubal ligation.   Past Medical History:  Diagnosis Date  . Abnormal Pap smear of cervix   . High-risk pregnancy   . Premature delivery    Past Surgical History:  Procedure Laterality Date  . CESAREAN SECTION    . CHOLECYSTECTOMY     OB History  Gravida Para Term Preterm AB Living  1            SAB TAB Ectopic Multiple Live Births               # Outcome Date GA Lbr Len/2nd Weight Sex Delivery Anes PTL Lv  1 Current             Patient denies any other pertinent gynecologic issues.   Current Outpatient Prescriptions on File Prior to Visit  Medication Sig Dispense Refill  . acetaminophen (TYLENOL) 500 MG tablet Take 1,000 mg by mouth every 6 (six) hours as needed (for headaches/pain.).    Marland Kitchen Prenatal MV & Min w/FA-DHA (PRENATAL ADULT GUMMY/DHA/FA) 0.4-25 MG CHEW Chew 1 Dose by mouth 2 (two) times daily.     No current facility-administered medications on file prior to visit.    Allergies: No Known Allergies  Social History:   reports that she has been smoking.  She has never used smokeless tobacco. She reports that she does not drink alcohol or use drugs.  Family History: breast cancer, colon cancer, congestive heart failure, T2DM, HTN, renal failure, stroke  Review of Systems: Noncontributory  PHYSICAL EXAM: Blood pressure 112/74, weight 192 lb (87.1 kg), last menstrual period 08/08/2015. CONSTITUTIONAL:  Well-developed, well-nourished female in no acute distress.  HENT:  Normocephalic, atraumatic, External right and left ear normal. Oropharynx is clear and moist EYES: Conjunctivae and EOM are normal. Pupils are equal, round, and reactive to light. No scleral icterus.  NECK: Normal range of motion, supple, no masses SKIN: Skin is warm and dry. No rash noted. Not diaphoretic. No erythema. No pallor. NEUROLGIC: Alert and oriented to person, place, and time. Normal reflexes, muscle tone coordination. No cranial nerve deficit noted. PSYCHIATRIC: Normal mood and affect. Normal behavior. Normal judgment and thought content. CARDIOVASCULAR: Normal heart rate noted, regular rhythm RESPIRATORY: Effort and breath sounds normal, no problems with respiration noted ABDOMEN: Soft, nontender, nondistended. PELVIC: Deferred MUSCULOSKELETAL: Normal range of motion. No edema and no tenderness. 2+ distal pulses. FHR: normal   Assessment: Patient Active Problem List   Diagnosis Date Noted  . Encounter for supervision of high risk pregnancy in third trimester, antepartum 04/07/2016  . Previous cesarean section complicating pregnancy, antepartum condition or complication 04/07/2016  . Tobacco use 04/07/2016  . Previous preterm delivery in second trimester, antepartum 04/07/2016    Plan: Patient will undergo surgical management with cesarean delivery and bilateral tubal ligation.   The risks of surgery were discussed in detail with the patient including but not limited to: bleeding which may require transfusion or reoperation; infection  which may require antibiotics; injury to surrounding organs which may involve bowel, bladder, ureters ; need for additional procedures including laparoscopy or laparotomy; thromboembolic phenomenon, surgical site problems and other postoperative/anesthesia complications. Likelihood of success in alleviating the patient's condition was discussed. Routine postoperative instructions  will be reviewed with the patient and her family in detail after surgery.  The patient concurred with the proposed plan, giving informed written consent for the surgery. Preoperative prophylactic antibiotics and SCDs ordered on call to the OR.    Risk of failure of tubal ligation discussed.  Risk is 0.3-0.5% with increased risk of ectopic pregnancy, should pregnancy occur.  She understands there are alternative forms of contraception that are equivalent in efficacy to a tubal ligation and are non-permanent.   Thomasene Mohair, MD 05/10/2016 9:15 AM

## 2016-05-10 NOTE — Patient Instructions (Signed)
  Your procedure is scheduled on: Tuesday May 11, 2016. Report to Emergency Room at 5:50 am.   Remember: Instructions that are not followed completely may result in serious medical risk, up to and including death, or upon the discretion of your surgeon and anesthesiologist your surgery may need to be rescheduled.    _x___ 1. Do not eat food or drink liquids after midnight. No gum chewing or hard candies.     ____ 2. No Alcohol for 24 hours before or after surgery.   ____ 3. Bring all medications with you on the day of surgery if instructed.    __x__ 4. Notify your doctor if there is any change in your medical condition     (cold, fever, infections).    __x___ 5. No smoking 24 hours prior to surgery.     Do not wear jewelry, make-up, hairpins, clips or nail polish.  Do not wear lotions, powders, or perfumes.   Do not shave 48 hours prior to surgery. Men may shave face and neck.  Do not bring valuables to the hospital.    [facility] is not responsible for any belongings or valuables.               Contacts, dentures or bridgework may not be worn into surgery.  Leave your suitcase in the car. After surgery it may be brought to your room.  For patients admitted to the hospital, discharge time is determined by your treatment team.   Patients discharged the day of surgery will not be allowed to drive home.    Please read over the following fact sheets that you were given:   [facility] Preparing for Surgery  ____ Take these medicines the morning of surgery with A SIP OF WATER: NONE    ____ Fleet Enema (as directed)   _x___ Use CHG Soap as directed on instruction sheet  ____ Use inhalers on the day of surgery and bring to hospital day of surgery  ____ Stop metformin 2 days prior to surgery    ____ Take 1/2 of usual insulin dose the night before surgery and none on the morning of surgery.   ____ Stop Coumadin/Plavix/aspirin on does not apply.  ____ Stop Anti-inflammatories  such as Advil, Aleve, Ibuprofen, Motrin, Naproxen,  Naprosyn, Goodies powders or aspirin products. OK to take Tylenol.   ____ Stop supplements until after surgery.    ____ Bring C-Pap to the hospital.

## 2016-05-10 NOTE — Progress Notes (Signed)
H&P for surgery.

## 2016-05-11 ENCOUNTER — Inpatient Hospital Stay
Admission: EM | Admit: 2016-05-11 | Discharge: 2016-05-13 | DRG: 766 | Disposition: A | Discharge status: Home / Self Care | Payer: Medicaid Other | Attending: Obstetrics and Gynecology | Admitting: Obstetrics and Gynecology

## 2016-05-11 ENCOUNTER — Encounter: Payer: Self-pay | Admitting: Obstetrics and Gynecology

## 2016-05-11 ENCOUNTER — Inpatient Hospital Stay: Payer: Medicaid Other | Admitting: Certified Registered"

## 2016-05-11 ENCOUNTER — Inpatient Hospital Stay
Admission: RE | Admit: 2016-05-11 | Payer: Medicaid Other | Source: Ambulatory Visit | Admitting: Obstetrics and Gynecology

## 2016-05-11 ENCOUNTER — Encounter
Admission: EM | Disposition: A | Discharge status: Home / Self Care | Payer: Self-pay | Source: Home / Self Care | Attending: Obstetrics and Gynecology

## 2016-05-11 DIAGNOSIS — O9902 Anemia complicating childbirth: Secondary | ICD-10-CM | POA: Diagnosis present

## 2016-05-11 DIAGNOSIS — D649 Anemia, unspecified: Secondary | ICD-10-CM | POA: Diagnosis present

## 2016-05-11 DIAGNOSIS — Z98891 History of uterine scar from previous surgery: Secondary | ICD-10-CM

## 2016-05-11 DIAGNOSIS — O0993 Supervision of high risk pregnancy, unspecified, third trimester: Secondary | ICD-10-CM

## 2016-05-11 DIAGNOSIS — Z302 Encounter for sterilization: Secondary | ICD-10-CM

## 2016-05-11 DIAGNOSIS — Z3A39 39 weeks gestation of pregnancy: Secondary | ICD-10-CM

## 2016-05-11 DIAGNOSIS — O34211 Maternal care for low transverse scar from previous cesarean delivery: Secondary | ICD-10-CM

## 2016-05-11 DIAGNOSIS — O34219 Maternal care for unspecified type scar from previous cesarean delivery: Secondary | ICD-10-CM

## 2016-05-11 LAB — RPR: RPR Ser Ql: NONREACTIVE

## 2016-05-11 LAB — ABO/RH: ABO/RH(D): A POS

## 2016-05-11 SURGERY — Surgical Case
Anesthesia: Spinal | Site: Abdomen | Wound class: Clean

## 2016-05-11 MED ORDER — DIPHENHYDRAMINE HCL 50 MG/ML IJ SOLN: 12.5000 mg | INTRAMUSCULAR | Status: DC | PRN

## 2016-05-11 MED ORDER — OXYTOCIN 40 UNITS IN LACTATED RINGERS INFUSION - SIMPLE MED
INTRAVENOUS | Status: AC
  Filled 2016-05-11: qty 1000

## 2016-05-11 MED ORDER — COCONUT OIL OIL: 1.0000 "application " | TOPICAL_OIL | Status: DC | PRN

## 2016-05-11 MED ORDER — NALBUPHINE HCL 10 MG/ML IJ SOLN: 5.0000 mg | Freq: Once | INTRAMUSCULAR | Status: DC | PRN

## 2016-05-11 MED ORDER — IBUPROFEN 600 MG PO TABS
600.0000 mg | ORAL_TABLET | Freq: Four times a day (QID) | ORAL | Status: DC
  Administered 2016-05-12 – 2016-05-13 (×5): 600 mg via ORAL
  Filled 2016-05-11 (×5): qty 1

## 2016-05-11 MED ORDER — KETOROLAC TROMETHAMINE 30 MG/ML IJ SOLN
30.0000 mg | Freq: Four times a day (QID) | INTRAMUSCULAR | Status: AC
  Administered 2016-05-11 – 2016-05-12 (×3): 30 mg via INTRAVENOUS
  Filled 2016-05-11 (×3): qty 1

## 2016-05-11 MED ORDER — MEASLES, MUMPS & RUBELLA VAC ~~LOC~~ INJ
0.5000 mL | INJECTION | SUBCUTANEOUS | Status: AC | PRN
  Administered 2016-05-13: 0.5 mL via SUBCUTANEOUS
  Filled 2016-05-11: qty 0.5

## 2016-05-11 MED ORDER — ACETAMINOPHEN 325 MG PO TABS
650.0000 mg | ORAL_TABLET | Freq: Four times a day (QID) | ORAL | Status: AC
  Administered 2016-05-11: 650 mg via ORAL
  Filled 2016-05-11 (×2): qty 2

## 2016-05-11 MED ORDER — BUPIVACAINE HCL (PF) 0.5 % IJ SOLN
5.0000 mL | Freq: Once | INTRAMUSCULAR | Status: DC
  Filled 2016-05-11: qty 10

## 2016-05-11 MED ORDER — SCOPOLAMINE 1 MG/3DAYS TD PT72: 1.0000 | MEDICATED_PATCH | Freq: Once | TRANSDERMAL | Status: DC

## 2016-05-11 MED ORDER — NALBUPHINE HCL 10 MG/ML IJ SOLN: 5.0000 mg | INTRAMUSCULAR | Status: DC | PRN

## 2016-05-11 MED ORDER — SOD CITRATE-CITRIC ACID 500-334 MG/5ML PO SOLN
ORAL | Status: AC
  Administered 2016-05-11: 30 mL
  Filled 2016-05-11: qty 15

## 2016-05-11 MED ORDER — OXYTOCIN 40 UNITS IN LACTATED RINGERS INFUSION - SIMPLE MED
INTRAVENOUS | Status: DC | PRN
  Administered 2016-05-11: 700 mL via INTRAVENOUS

## 2016-05-11 MED ORDER — BUPIVACAINE IN DEXTROSE 0.75-8.25 % IT SOLN
INTRATHECAL | Status: DC | PRN
  Administered 2016-05-11: 1.8 mL via INTRATHECAL

## 2016-05-11 MED ORDER — FENTANYL CITRATE (PF) 100 MCG/2ML IJ SOLN
INTRAMUSCULAR | Status: DC | PRN
  Administered 2016-05-11: 15 ug via INTRATHECAL

## 2016-05-11 MED ORDER — WITCH HAZEL-GLYCERIN EX PADS: 1.0000 "application " | MEDICATED_PAD | CUTANEOUS | Status: DC | PRN

## 2016-05-11 MED ORDER — KETOROLAC TROMETHAMINE 30 MG/ML IJ SOLN: 30.0000 mg | Freq: Four times a day (QID) | INTRAMUSCULAR | Status: AC

## 2016-05-11 MED ORDER — OXYTOCIN 40 UNITS IN LACTATED RINGERS INFUSION - SIMPLE MED
2.5000 [IU]/h | INTRAVENOUS | Status: AC
  Administered 2016-05-11: 2.5 [IU]/h via INTRAVENOUS

## 2016-05-11 MED ORDER — DIBUCAINE 1 % RE OINT: 1.0000 "application " | TOPICAL_OINTMENT | RECTAL | Status: DC | PRN

## 2016-05-11 MED ORDER — BUPIVACAINE 0.25 % ON-Q PUMP DUAL CATH 400 ML
400.0000 mL | INJECTION | Status: DC
  Filled 2016-05-11: qty 400

## 2016-05-11 MED ORDER — LACTATED RINGERS IV SOLN
INTRAVENOUS | Status: DC
  Administered 2016-05-11: 06:00:00 via INTRAVENOUS

## 2016-05-11 MED ORDER — MENTHOL 3 MG MT LOZG: 1.0000 | LOZENGE | OROMUCOSAL | Status: DC | PRN

## 2016-05-11 MED ORDER — LACTATED RINGERS IV SOLN
INTRAVENOUS | Status: DC
  Administered 2016-05-11: 23:00:00 via INTRAVENOUS

## 2016-05-11 MED ORDER — OXYTOCIN 40 UNITS IN LACTATED RINGERS INFUSION - SIMPLE MED
INTRAVENOUS | Status: AC
  Administered 2016-05-11: 2.5 [IU]/h via INTRAVENOUS
  Filled 2016-05-11: qty 1000

## 2016-05-11 MED ORDER — OXYCODONE-ACETAMINOPHEN 5-325 MG PO TABS
1.0000 | ORAL_TABLET | ORAL | Status: DC | PRN
Start: 2016-05-12 — End: 2016-05-13

## 2016-05-11 MED ORDER — ONDANSETRON HCL 4 MG/2ML IJ SOLN: 4.0000 mg | Freq: Three times a day (TID) | INTRAMUSCULAR | Status: DC | PRN

## 2016-05-11 MED ORDER — MORPHINE SULFATE (PF) 0.5 MG/ML IJ SOLN
INTRAMUSCULAR | Status: DC | PRN
  Administered 2016-05-11: .1 mg via INTRATHECAL

## 2016-05-11 MED ORDER — FERROUS SULFATE 325 (65 FE) MG PO TABS
325.0000 mg | ORAL_TABLET | Freq: Two times a day (BID) | ORAL | Status: DC
  Administered 2016-05-11 – 2016-05-13 (×4): 325 mg via ORAL
  Filled 2016-05-11 (×4): qty 1

## 2016-05-11 MED ORDER — CEFAZOLIN SODIUM-DEXTROSE 2-4 GM/100ML-% IV SOLN
2.0000 g | INTRAVENOUS | Status: AC
  Administered 2016-05-11: 2 g via INTRAVENOUS

## 2016-05-11 MED ORDER — NALOXONE HCL 0.4 MG/ML IJ SOLN: 0.4000 mg | INTRAMUSCULAR | Status: DC | PRN

## 2016-05-11 MED ORDER — BUPIVACAINE HCL (PF) 0.5 % IJ SOLN
INTRAMUSCULAR | Status: AC
  Filled 2016-05-11: qty 30

## 2016-05-11 MED ORDER — DIPHENHYDRAMINE HCL 25 MG PO CAPS: 25.0000 mg | ORAL_CAPSULE | ORAL | Status: DC | PRN

## 2016-05-11 MED ORDER — SODIUM CHLORIDE 0.9% FLUSH: 3.0000 mL | INTRAVENOUS | Status: DC | PRN

## 2016-05-11 MED ORDER — OXYCODONE HCL 5 MG PO TABS: 10.0000 mg | ORAL_TABLET | ORAL | Status: DC | PRN

## 2016-05-11 MED ORDER — LACTATED RINGERS IV SOLN
INTRAVENOUS | Status: DC
  Administered 2016-05-11: 07:00:00 via INTRAVENOUS

## 2016-05-11 MED ORDER — MEPERIDINE HCL 25 MG/ML IJ SOLN: 6.2500 mg | INTRAMUSCULAR | Status: DC | PRN

## 2016-05-11 MED ORDER — EPHEDRINE SULFATE 50 MG/ML IJ SOLN
INTRAMUSCULAR | Status: DC | PRN
  Administered 2016-05-11 (×2): 5 mg via INTRAVENOUS

## 2016-05-11 MED ORDER — OXYCODONE HCL 5 MG PO TABS: 5.0000 mg | ORAL_TABLET | ORAL | Status: DC | PRN

## 2016-05-11 MED ORDER — BUPIVACAINE HCL (PF) 0.5 % IJ SOLN
INTRAMUSCULAR | Status: DC | PRN
  Administered 2016-05-11: 10 mL

## 2016-05-11 MED ORDER — ONDANSETRON HCL 4 MG/2ML IJ SOLN
INTRAMUSCULAR | Status: DC | PRN
  Administered 2016-05-11: 4 mg via INTRAVENOUS

## 2016-05-11 MED ORDER — LACTATED RINGERS IV SOLN
INTRAVENOUS | Status: DC | PRN
  Administered 2016-05-11: 08:00:00 via INTRAVENOUS

## 2016-05-11 MED ORDER — HEMOSTATIC AGENTS (NO CHARGE) OPTIME
TOPICAL | Status: DC | PRN
  Administered 2016-05-11: 1 via TOPICAL

## 2016-05-11 MED ORDER — DIPHENHYDRAMINE HCL 25 MG PO CAPS: 25.0000 mg | ORAL_CAPSULE | Freq: Four times a day (QID) | ORAL | Status: DC | PRN

## 2016-05-11 MED ORDER — NALOXONE HCL 2 MG/2ML IJ SOSY
1.0000 ug/kg/h | PREFILLED_SYRINGE | INTRAMUSCULAR | Status: DC | PRN
  Filled 2016-05-11: qty 2

## 2016-05-11 MED ORDER — PRENATAL MULTIVITAMIN CH
1.0000 | ORAL_TABLET | Freq: Every day | ORAL | Status: DC
  Administered 2016-05-12 – 2016-05-13 (×2): 1 via ORAL
  Filled 2016-05-11 (×2): qty 1

## 2016-05-11 MED ORDER — SIMETHICONE 80 MG PO CHEW
80.0000 mg | CHEWABLE_TABLET | Freq: Three times a day (TID) | ORAL | Status: DC
  Administered 2016-05-11 – 2016-05-13 (×5): 80 mg via ORAL
  Filled 2016-05-11 (×5): qty 1

## 2016-05-11 MED ORDER — SENNOSIDES-DOCUSATE SODIUM 8.6-50 MG PO TABS
2.0000 | ORAL_TABLET | ORAL | Status: DC
  Administered 2016-05-12 – 2016-05-13 (×2): 2 via ORAL
  Filled 2016-05-11 (×2): qty 2

## 2016-05-11 MED ORDER — OXYCODONE-ACETAMINOPHEN 5-325 MG PO TABS: 2.0000 | ORAL_TABLET | ORAL | Status: DC | PRN

## 2016-05-11 MED ORDER — PHENYLEPHRINE 40 MCG/ML (10ML) SYRINGE FOR IV PUSH (FOR BLOOD PRESSURE SUPPORT)
PREFILLED_SYRINGE | INTRAVENOUS | Status: DC | PRN
  Administered 2016-05-11 (×7): 100 ug via INTRAVENOUS

## 2016-05-11 SURGICAL SUPPLY — 34 items
BARRIER ADHS 3X4 INTERCEED (GAUZE/BANDAGES/DRESSINGS) ×3 IMPLANT
CANISTER SUCT 3000ML (MISCELLANEOUS) ×3 IMPLANT
CATH KIT ON-Q SILVERSOAK 5IN (CATHETERS) ×6 IMPLANT
CHLORAPREP W/TINT 26ML (MISCELLANEOUS) ×6 IMPLANT
CLOSURE WOUND 1/2 X4 (GAUZE/BANDAGES/DRESSINGS) ×1
CUP MEDICINE 2OZ PLAST GRAD ST (MISCELLANEOUS) ×3 IMPLANT
DERMABOND ADVANCED (GAUZE/BANDAGES/DRESSINGS) ×8
DERMABOND ADVANCED .7 DNX12 (GAUZE/BANDAGES/DRESSINGS) ×4 IMPLANT
DRSG OPSITE POSTOP 4X10 (GAUZE/BANDAGES/DRESSINGS) ×6 IMPLANT
DRSG TELFA 3X8 NADH (GAUZE/BANDAGES/DRESSINGS) ×3 IMPLANT
ELECT REM PT RETURN 9FT ADLT (ELECTROSURGICAL) ×3
ELECTRODE REM PT RTRN 9FT ADLT (ELECTROSURGICAL) ×1 IMPLANT
GAUZE SPONGE 4X4 12PLY STRL (GAUZE/BANDAGES/DRESSINGS) ×3 IMPLANT
GLOVE BIO SURGEON STRL SZ8 (GLOVE) ×3 IMPLANT
GLOVE BIOGEL PI IND STRL 7.5 (GLOVE) ×1 IMPLANT
GLOVE BIOGEL PI INDICATOR 7.5 (GLOVE) ×2
GLOVE SKINSENSE NS SZ7.0 (GLOVE) ×2
GLOVE SKINSENSE STRL SZ7.0 (GLOVE) ×1 IMPLANT
GOWN STRL REUS W/ TWL LRG LVL3 (GOWN DISPOSABLE) ×5 IMPLANT
GOWN STRL REUS W/TWL LRG LVL3 (GOWN DISPOSABLE) ×10
NDL SAFETY 22GX1.5 (NEEDLE) ×3 IMPLANT
NS IRRIG 1000ML POUR BTL (IV SOLUTION) ×3 IMPLANT
PACK C SECTION AR (MISCELLANEOUS) ×3 IMPLANT
PAD OB MATERNITY 4.3X12.25 (PERSONAL CARE ITEMS) ×6 IMPLANT
PAD PREP 24X41 OB/GYN DISP (PERSONAL CARE ITEMS) ×3 IMPLANT
STRIP CLOSURE SKIN 1/2X4 (GAUZE/BANDAGES/DRESSINGS) ×2 IMPLANT
SUT 2-0 PL GUT LIGAPAK (SUTURE) ×3 IMPLANT
SUT MNCRL AB 4-0 PS2 18 (SUTURE) ×3 IMPLANT
SUT PDS AB 1 TP1 96 (SUTURE) ×6 IMPLANT
SUT PLAIN 2 0 XLH (SUTURE) ×3 IMPLANT
SUT VIC AB 0 CT1 36 (SUTURE) ×12 IMPLANT
SUT VIC AB 1 CT1 36 (SUTURE) ×3 IMPLANT
SWABSTK COMLB BENZOIN TINCTURE (MISCELLANEOUS) ×3 IMPLANT
SYRINGE 10CC LL (SYRINGE) ×3 IMPLANT

## 2016-05-11 NOTE — Transfer of Care (Signed)
Immediate Anesthesia Transfer of Care Note  Patient: Wendy Bishop  Procedure(s) Performed: Procedure(s) with comments: CESAREAN SECTION WITH BILATERAL TUBAL LIGATION (N/A) - Baby Boy born @ 35 Apgars: 8/9 Weight: 7lb 8oz  Patient Location: Mother/Baby  Anesthesia Type:Spinal  Level of Consciousness: awake, alert  and oriented  Airway & Oxygen Therapy: Patient Spontanous Breathing  Post-op Assessment: Report given to RN and Post -op Vital signs reviewed and stable  Post vital signs: Reviewed  Last Vitals:  Vitals:   05/11/16 0700 05/11/16 0927  BP: 108/66 (!) 95/52  Pulse: 82 74  Resp: 18 18  Temp: 36.8 C 36.4 C    Last Pain:  Vitals:   05/11/16 0801  TempSrc:   PainSc: 0-No pain         Complications: No apparent anesthesia complications

## 2016-05-11 NOTE — Op Note (Signed)
Cesarean Section Operative Note    Wendy Bishop   05/11/2016   Pre-operative Diagnosis:  1) intrauterine pregnancy at [redacted]w[redacted]d  2) history of prior cesarean section, desires repeat 3) desires permanent sterility  Post-operative Diagnosis:  1) intrauterine pregnancy at [redacted]w[redacted]d  2) history of prior cesarean section, desires repeat 3) desires permanent sterility  Procedure:  1) repeat low transverse cesarean section via pfannenstiel incision with double layer uterine closure 2) bilateral tubal ligation via Pomeroy method  Surgeon: Surgeon(s) and Role:    * Conard Novak, MD - Primary   Anesthesia: spinal   Findings:  1) normal appearing gravid uterus, fallopian tubes, and ovaries 2) viable female infant with APGARS 8 and 9, weight 3,400 grams   Estimated Blood Loss: 750 mL  Total IV Fluids: 1,500 ml crystalloid  Urine Output: 100 mL clear urine at end of procedure  Specimens: portion of right and left fallopian tube  Complications: no complications  Disposition: PACU - hemodynamically stable.   Maternal Condition: stable   Baby condition / location:  Couplet care / Skin to Skin  Procedure Details:  The patient was seen in the Holding Room. The risks, benefits, complications, treatment options, and expected outcomes were discussed with the patient. The patient concurred with the proposed plan, giving informed consent. identified as Wendy Bishop and the procedure verified as C-Section Delivery. A Time Out was held and the above information confirmed.   After induction of anesthesia, the patient was draped and prepped in the usual sterile manner. A Pfannenstiel incision was made and carried down through the subcutaneous tissue to the fascia. Fascial incision was made and extended transversely. The fascia was separated from the underlying rectus tissue superiorly and inferiorly. The peritoneum was identified and entered. Peritoneal incision was extended longitudinally.  The bladder flap was sharply freed from the lower uterine segment. A low transverse uterine incision was made and the hysterotomy was extended with cranial-caudal tension. Delivered from cephalic presentation was a 3,400 gram Living newborn infant(s) or Female with Apgar scores of 8 at one minute and 9 at five minutes. Cord ph was not sent the umbilical cord was clamped and cut cord blood was not obtained for evaluation. The placenta was removed Intact and appeared normal. The uterine outline, tubes and ovaries appeared normal. The uterine incision was closed with running locked sutures of 0 Vicryl.  A second layer of the same suture was thrown in an imbricating fashion.  Hemostasis was assured.    The tubal ligation portion of the procedure was performed at this point.  The left fallopian tube was identified and followed out to the fimbriated end.  A Babcock clamp was used to grasp the tube in the mid-isthmic portion and two 2-0 plain gut sutures were used to ligate the tube.  An approximately 3cm segment of tube was removed with hemostasis assured.  The same procedure was performed on the right fallopian tube with hemostasis noted.   The uterus was returned to the abdomen and the paracolic gutters were cleared of all clots and debris.  The rectus muscles were inspected and found to be hemostatic.  The On-Q catheter pumps were inserted in accordance with the manufacturer's recommendations.  The catheters were inserted approximately 4cm cephelad to the incision line, approximately 1cm apart, straddling the midline.  They were inserted to a depth of the 4th mark. They were positioned superficial to the rectus abdominus muscles and deep to the rectus fascia.    The fascia  was then reapproximated with running sutures of 1-0 PDS, looped. The subcutaneous tissue was reapproximated using 2-0 plain gut such that no greater than 2cm of dead space remained. The subcuticular closure was performed using 4-0 monocryl. The  skin closure was reinforced using surgical skin glue.   The On-Q catheters were bolused with 5 mL of 0.5% marcaine plain for a total of 10 mL.  The catheters were affixed to the skin with surgical skin glue, steri-strips, and tegaderm.    Instrument, sponge, and needle counts were correct prior the abdominal closure and were correct at the conclusion of the case.  The patient received Ancef 2 gram IV prior to skin incision (within 30 minutes). For VTE prophylaxis she was wearing SCDs throughout the case.   Signed: Conard Novak, MD 05/11/2016 9:12 AM

## 2016-05-11 NOTE — H&P (Signed)
History and Physical Interval Note:  Wendy Bishop  has presented today for surgery, with the diagnosis of prior cesarean,desire for permanent sterility  The various methods of treatment have been discussed with the patient and family. After consideration of risks, benefits and other options for treatment, the patient has consented to  Procedure(s): CESAREAN SECTION WITH BILATERAL TUBAL LIGATION (N/A) as a surgical intervention .  The patient's history has been reviewed, patient examined, no change in status, stable for surgery.  I have reviewed the patient's chart and labs.  Questions were answered to the patient's satisfaction.     Thomasene Mohair, MD 05/11/2016 7:14 AM

## 2016-05-11 NOTE — Discharge Summary (Signed)
OB Discharge Summary     Patient Name: Wendy Bishop DOB: 1990/02/24 MRN: [id number]  Date of admission: 05/11/2016 Delivering MD: Prentice Docker, MD  Date of Delivery: 05/11/2016  Date of discharge: 05/13/2016 Admitting diagnosis: prior cesarean,desire for permanent sterility   Intrauterine pregnancy: [redacted]w[redacted]d     Secondary diagnosis: None     Discharge diagnosis: Term Pregnancy Delivered                                                                                                Post partum procedures:MMR  Augmentation: n/a  Complications: None  Hospital course:  Sceduled C/S   26y.o. yo [id number] admitted to the hospital 05/11/2016 for scheduled cesarean section with the following indication:Elective Repeat and desire for permanent sterility.  Membrane Rupture Time/Date: 05/11/2016 at time of surgery Patient delivered a Viable infant.05/11/2016 7#7.9oz female infant Details of operation can be found in separate operative note.  Pateint had an uncomplicated postpartum course.  She is ambulating, tolerating a regular diet, passing flatus, and urinating well. Patient is discharged home in stable condition on  05/13/16         Physical exam  Vitals:   05/12/16 1934 05/13/16 0010 05/13/16 0325 05/13/16 0751  BP: (!) 112/53   116/67  Pulse: 78   72  Resp: 20   18  Temp: 99.1 F (37.3 C) 98.1 F (36.7 C) 98 F (36.7 C) 98.6 F (37 C)  TempSrc: Oral Oral Oral Oral  SpO2:    100%  Weight:      Height:       General: alert, cooperative and no distress Lochia: appropriate Heart: RRR without murmur Lungs: CTAB, no respiratory difficulty Uterine Fundus: firm/ U-2/ ML/ NT Incision: Healing well with no significant drainage, Dressing is clean, dry, and intact. Requests ON Q be removed prior to discharge DVT Evaluation: No evidence of DVT seen on physical exam. No cords or calf tenderness. No significant calf/ankle edema.  Labs: Lab Results  Component Value Date    WBC 11.1 (H) 05/12/2016   HGB 10.6 (L) 05/12/2016   HCT 31.4 (L) 05/12/2016   MCV 93.4 05/12/2016   PLT 405 05/12/2016    Discharge instruction: per After Visit Summary.  Medications:  Allergies as of 05/13/2016   No Known Allergies     Medication List    STOP taking these medications   acetaminophen 500 MG tablet Commonly known as:  TYLENOL   Prenatal Adult Gummy/DHA/FA 0.4-25 MG Chew     TAKE these medications   CITRANATAL BLOOM 90-1 MG Tabs Take 1 tablet by mouth daily at 2 PM.   ibuprofen 600 MG tablet Commonly known as:  ADVIL,MOTRIN Take 1 tablet (600 mg total) by mouth every 6 (six) hours as needed for headache, mild pain, moderate pain or cramping.   oxyCODONE-acetaminophen 5-325 MG tablet Commonly known as:  PERCOCET/ROXICET Take 1-2 tablets by mouth every 6 (six) hours as needed for moderate pain or severe pain (pain score 4-7/10).       Diet: routine diet  Activity: Advance as tolerated. Pelvic  rest for 6 weeks.   Outpatient follow up: Follow-up Information    Prentice Docker, MD. Go on 05/21/2016.   Specialty:  Obstetrics and Gynecology Why:  Postop incision check on Friday May 21, 2016 at 3:10 PM Contact information: [address] [place] [address] [phone]             Postpartum contraception: Tubal Ligation Rhogam Given postpartum: no Rubella vaccine given postpartum: yes Varicella vaccine given postpartum: no TDaP given antepartum or postpartum: AP  Newborn Data: Live born female / Waylynn Birth Weight: 7 lb 7.9 oz (3400 g) APGAR: 8, 9   Baby Feeding: Bottle  Disposition:home with mother  SIGNED: Dalia Heading, CNM

## 2016-05-11 NOTE — Anesthesia Post-op Follow-up Note (Cosign Needed)
Anesthesia QCDR form completed.        

## 2016-05-11 NOTE — Anesthesia Procedure Notes (Signed)
Spinal  Patient location during procedure: OR Start time: 05/11/2016 7:47 AM End time: 05/11/2016 7:53 AM Staffing Anesthesiologist: Randa Lynn, AMY Resident/CRNA: Rolla Plate Performed: resident/CRNA  Preanesthetic Checklist Completed: patient identified, site marked, surgical consent, pre-op evaluation, timeout performed, IV checked, risks and benefits discussed and monitors and equipment checked Spinal Block Patient position: sitting Prep: ChloraPrep Patient monitoring: heart rate, continuous pulse ox, blood pressure and cardiac monitor Approach: midline Location: L4-5 Injection technique: single-shot Needle Needle type: Whitacre and Introducer  Needle gauge: 25 G Needle length: 9 cm Additional Notes Negative paresthesia. Negative blood return. Positive free-flowing CSF. Expiration date of kit checked and confirmed. Patient tolerated procedure well, without complications.

## 2016-05-11 NOTE — Anesthesia Preprocedure Evaluation (Addendum)
Anesthesia Evaluation  Patient identified by MRN, date of birth, ID band Patient awake    Reviewed: Allergy & Precautions, NPO status , Patient's Chart, lab work & pertinent test results  History of Anesthesia Complications Negative for: history of anesthetic complications  Airway Mallampati: II  TM Distance: >3 FB Neck ROM: Full    Dental no notable dental hx.    Pulmonary neg sleep apnea, neg COPD, Current Smoker,    breath sounds clear to auscultation- rhonchi (-) wheezing      Cardiovascular Exercise Tolerance: Good (-) hypertension(-) CAD and (-) Past MI  Rhythm:Regular Rate:Normal - Systolic murmurs and - Diastolic murmurs    Neuro/Psych negative neurological ROS  negative psych ROS   GI/Hepatic negative GI ROS, Neg liver ROS,   Endo/Other  negative endocrine ROSneg diabetes  Renal/GU negative Renal ROS     Musculoskeletal negative musculoskeletal ROS (+)   Abdominal Gravid abdomen  Peds  Hematology negative hematology ROS (+)   Anesthesia Other Findings 2 prior csections, 1st for fetal intolerance of labor, 2nd was scheduled repeat  Reproductive/Obstetrics (+) Pregnancy                             Anesthesia Physical Anesthesia Plan  ASA: II  Anesthesia Plan: Spinal   Post-op Pain Management:    Induction:   Airway Management Planned: Natural Airway  Additional Equipment:   Intra-op Plan:   Post-operative Plan:   Informed Consent: I have reviewed the patients History and Physical, chart, labs and discussed the procedure including the risks, benefits and alternatives for the proposed anesthesia with the patient or authorized representative who has indicated his/her understanding and acceptance.   Dental advisory given  Plan Discussed with: Anesthesiologist and CRNA  Anesthesia Plan Comments:         Lab Results  Component Value Date   WBC 13.0 (H)  05/10/2016   HGB 11.3 (L) 05/10/2016   HCT 33.1 (L) 05/10/2016   MCV 92.8 05/10/2016   PLT 446 (H) 05/10/2016    Anesthesia Quick Evaluation

## 2016-05-12 LAB — CBC
HCT: 31.4 % — ABNORMAL LOW (ref 35.0–47.0)
Hemoglobin: 10.6 g/dL — ABNORMAL LOW (ref 12.0–16.0)
MCH: 31.4 pg (ref 26.0–34.0)
MCHC: 33.7 g/dL (ref 32.0–36.0)
MCV: 93.4 fL (ref 80.0–100.0)
Platelets: 405 10*3/uL (ref 150–440)
RBC: 3.36 MIL/uL — ABNORMAL LOW (ref 3.80–5.20)
RDW: 14.2 % (ref 11.5–14.5)
WBC: 11.1 10*3/uL — AB (ref 3.6–11.0)

## 2016-05-12 LAB — SURGICAL PATHOLOGY

## 2016-05-12 NOTE — Progress Notes (Signed)
Admit Date: 05/11/2016 Today's Date: 05/12/2016  Subjective: Postpartum Day 1: Cesarean Delivery Patient reports incisional pain, tolerating PO and no problems voiding.    Objective: Vital signs in last 24 hours: Temp:  [97.4 F (36.3 C)-98.8 F (37.1 C)] 98.5 F (36.9 C) (04/11 0807) Pulse Rate:  [58-78] 78 (04/11 0807) Resp:  [10-21] 20 (04/11 0807) BP: (83-102)/(40-63) 99/57 (04/11 0807) SpO2:  [97 %-100 %] 98 % (04/11 0807)  Physical Exam:  General: alert, cooperative and no distress Lochia: appropriate Uterine Fundus: firm Incision: healing well, no significant drainage, no significant erythema DVT Evaluation: No evidence of DVT seen on physical exam. Negative Homan's sign.   Recent Labs  05/10/16 1016 05/12/16 0620  HGB 11.3* 10.6*  HCT 33.1* 31.4*    Assessment/Plan: Status post Cesarean section and BTL. Doing well postoperatively.  Continue current care. Bottle feeding Monitor anemia; iron and PNV  Letitia Libra 05/12/2016, 9:37 AM

## 2016-05-12 NOTE — Addendum Note (Signed)
Addendum  created 05/12/16 0732 by Stormy Fabian, CRNA   Sign clinical note

## 2016-05-12 NOTE — Anesthesia Postprocedure Evaluation (Addendum)
Anesthesia Post Note  Patient: Wendy Bishop  Procedure(s) Performed: Procedure(s) (LRB): CESAREAN SECTION WITH BILATERAL TUBAL LIGATION (N/A)  Patient location during evaluation: Mother Baby Anesthesia Type: Spinal Level of consciousness: oriented and awake and alert Pain management: pain level controlled Vital Signs Assessment: post-procedure vital signs reviewed and stable Respiratory status: spontaneous breathing, respiratory function stable and patient connected to nasal cannula oxygen Cardiovascular status: blood pressure returned to baseline and stable Postop Assessment: no headache and no backache Anesthetic complications: no     Last Vitals:  Vitals:   05/11/16 2317 05/12/16 0339  BP: (!) 99/48 101/63  Pulse: 72 78  Resp: 20 20  Temp: 36.5 C 36.8 C    Last Pain:  Vitals:   05/12/16 0339  TempSrc: Oral  PainSc:                  Starling Manns

## 2016-05-12 NOTE — Anesthesia Post-op Follow-up Note (Signed)
  Anesthesia Pain Follow-up Note  Patient: Wendy Bishop  Day #: 1  Date of Follow-up: 05/12/2016 Time: 7:30 AM  Last Vitals:  Vitals:   05/11/16 2317 05/12/16 0339  BP: (!) 99/48 101/63  Pulse: 72 78  Resp: 20 20  Temp: 36.5 C 36.8 C    Level of Consciousness: alert  Pain: none   Side Effects:None  Catheter Site Exam:clean, dry, no drainage     Plan: D/C from anesthesia care at surgeon's request  Starling Manns

## 2016-05-13 ENCOUNTER — Encounter: Payer: Self-pay | Admitting: Certified Nurse Midwife

## 2016-05-13 MED ORDER — CITRANATAL BLOOM 90-1 MG PO TABS: 1.0000 | ORAL_TABLET | Freq: Every day | ORAL | 2 refills | Status: DC

## 2016-05-13 MED ORDER — OXYCODONE-ACETAMINOPHEN 5-325 MG PO TABS: 1.0000 | ORAL_TABLET | Freq: Four times a day (QID) | ORAL | 0 refills | Status: DC | PRN

## 2016-05-13 MED ORDER — IBUPROFEN 600 MG PO TABS: 600.0000 mg | ORAL_TABLET | Freq: Four times a day (QID) | ORAL | 0 refills | Status: DC | PRN

## 2016-05-13 NOTE — Progress Notes (Signed)
Patient discharged home with infant and family. Discharge instructions, prescriptions and follow up appointment given to and reviewed with patient. Patient verbalized understanding. Escorted out via wheelchair by auxiliary.  

## 2016-05-21 ENCOUNTER — Encounter: Payer: Self-pay | Admitting: Obstetrics and Gynecology

## 2016-05-21 ENCOUNTER — Ambulatory Visit (INDEPENDENT_AMBULATORY_CARE_PROVIDER_SITE_OTHER): Payer: Medicaid Other | Admitting: Obstetrics and Gynecology

## 2016-05-21 VITALS — BP 118/78 | Ht 64.0 in | Wt 174.0 lb

## 2016-05-21 DIAGNOSIS — Z98891 History of uterine scar from previous surgery: Secondary | ICD-10-CM

## 2016-05-21 NOTE — Progress Notes (Signed)
   Postoperative Follow-up Patient presents post op from repeat low transverse cesarean section and BTL 10days ago for history of c-section and desire for permanent sterility.  Subjective: Patient reports no fevers, chills, nausea, vomiting.  Eating a regular diet without difficulty. The patient is not having any pain.    Objective: Vitals:   05/21/16 1513  BP: 118/78   Vital Signs: BP 118/78   Ht  (1.626 m)   Wt 174 lb (78.9 kg)   BMI 29.87 kg/m  Constitutional: Well nourished, well developed female in no acute distress.  HEENT: normal Skin: Warm and dry.  Extremity: no edema  Abdomen: soft, nontender, nondistended, clean, dry, intact and no erythema, induration, warmth, and tenderness    Assessment: 26 y.o. s/p repeat LTCS and BTL POD#10  stable  Plan: f/u for 6 weeks postpartum Wound care instructions given.    Thomasene Mohair MD 05/21/2016, 3:40 PM

## 2016-05-26 ENCOUNTER — Encounter: Payer: Medicaid Other | Admitting: Obstetrics and Gynecology

## 2016-06-24 ENCOUNTER — Ambulatory Visit (INDEPENDENT_AMBULATORY_CARE_PROVIDER_SITE_OTHER): Payer: Medicaid Other | Admitting: Obstetrics and Gynecology

## 2016-06-24 NOTE — Progress Notes (Signed)
Postpartum Visit  Chief Complaint: 6 week postpartum visit  History of Present Illness: Patient is a 26 y.o. [id number] presents for postpartum visit.  Date of delivery: 05/11/2016 Type of delivery: C-Section Episiotomy No.  Laceration: not applicable  Pregnancy or labor problems:  no Any problems since the delivery:  no  Newborn Details:  SINGLETON :  1. Baby's name: Waylynn. Birth weight: 7.08lb Maternal Details:  Breast Feeding:  No  Post partum depression/anxiety noted:  no Edinburgh Post-Partum Depression Score:  0  Date of last PAP: 10/10/2015/normal  Review of Systems: Review of Systems  Constitutional: Negative.   HENT: Negative.   Eyes: Negative.   Respiratory: Negative.   Cardiovascular: Negative.   Gastrointestinal: Negative.   Genitourinary: Negative.   Musculoskeletal: Negative.   Skin: Negative.   Neurological: Negative.   Psychiatric/Behavioral: Negative.     Past Medical History:  Past Medical History:  Diagnosis Date  . Abnormal Pap smear of cervix   . Complication of anesthesia    pt had nausea with her 2nd C-section  . High-risk pregnancy   . Premature delivery     Past Surgical History:  Past Surgical History:  Procedure Laterality Date  . CESAREAN SECTION    . CESAREAN SECTION WITH BILATERAL TUBAL LIGATION N/A 05/11/2016   Procedure: CESAREAN SECTION WITH BILATERAL TUBAL LIGATION;  Surgeon: Conard NovakStephen D Nicolasa Milbrath, MD;  Location: [facility] ORS;  Service: Obstetrics;  Laterality: N/A;  Baby Boy born @ [id number] Apgars: 8/9 Weight: 7lb 8oz  . CHOLECYSTECTOMY    . FRACTURE SURGERY Left 2013   5th toes fracture wire placed    Family History: denies history of gyn cancer  Social History:  Social History   Social History  . Marital status: Single    Spouse name: N/A  . Number of children: N/A  . Years of education: N/A   Occupational History  . Not on file.   Social History Main Topics  . Smoking status: Current Every Day Smoker    Packs/day: 0.50   . Smokeless tobacco: Never Used  . Alcohol use No  . Drug use: No  . Sexual activity: Yes    Partners: Male   Other Topics Concern  . Not on file   Social History Narrative  . No narrative on file    Allergies:  No Known Allergies  Medications: Prior to Admission medications   Medication Sig Start Date End Date Taking? Authorizing Provider  Prenatal-DSS-FeCb-FeGl-FA Cherlyn Labella(CITRANATAL BLOOM) 90-1 MG TABS Take 1 tablet by mouth daily at 2 PM. 05/13/16   Farrel ConnersGutierrez, Colleen, CNM    Physical Exam There were no vitals taken for this visit.  Physical Exam  Constitutional: She is oriented to person, place, and time. She appears well-developed and well-nourished. No distress.  Eyes: EOM are normal. No scleral icterus.  Neck: Normal range of motion. Neck supple.  Cardiovascular: Normal rate and regular rhythm.   Pulmonary/Chest: Effort normal and breath sounds normal. No respiratory distress. She has no wheezes. She has no rales.  Abdominal: Soft. Bowel sounds are normal. She exhibits no distension and no mass. There is no tenderness. There is no rebound and no guarding.  Musculoskeletal: Normal range of motion. She exhibits no edema.  Neurological: She is alert and oriented to person, place, and time. No cranial nerve deficit.  Skin: Skin is warm and dry. No erythema.  Psychiatric: She has a normal mood and affect. Her behavior is normal. Judgment normal.  deferred pelvic today per patient request.  Assessment: 26 y.o. [id number] presenting for 6 week postpartum visit  Plan:  1) Contraception: s/p BTL  2)  Pap - ASCCP guidelines and rational discussed.  Due in one year  3) Patient underwent screening for postpartum depression with no concerns noted.  4) Follow up 1 year for routine annual exam   Thomasene Mohair, MD 06/24/2016 10:40 AM

## 2016-12-31 IMAGING — US US OB COMP LESS 14 WK
1 series · 14 of 28 positions shown · non-contrast
Comparison: None relevant, 11/30/2010 Ob ultrasound

CLINICAL DATA: 25-year-old female us with vaginal bleeding in the
first trimester of pregnancy. Estimated gestational age by a first
ultrasound 13 weeks 3 days. Quantitative beta HCG pending. Initial
encounter.

EXAM:
OBSTETRIC <14 WK ULTRASOUND
TECHNIQUE: Transabdominal ultrasound was performed for evaluation of the
gestation as well as the maternal uterus and adnexal regions.

[Series 1: us ob comp less 14 wk · 0.20mm/px · 14 of 41 slices shown]
[im 2/41]
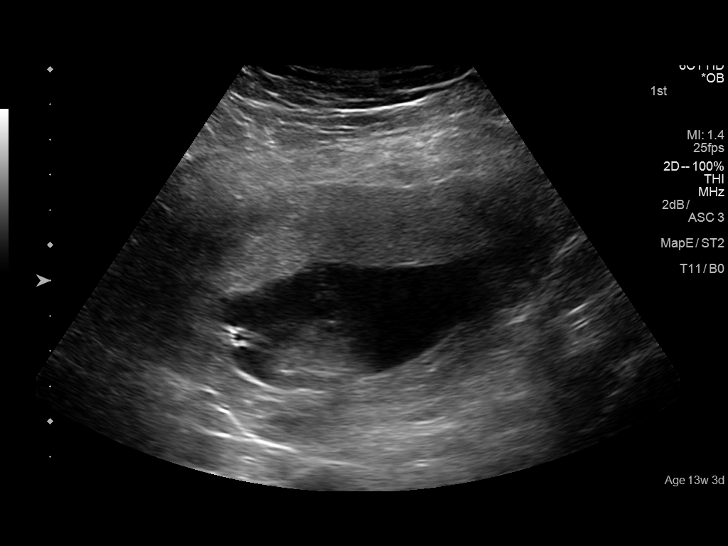
[im 5/41]
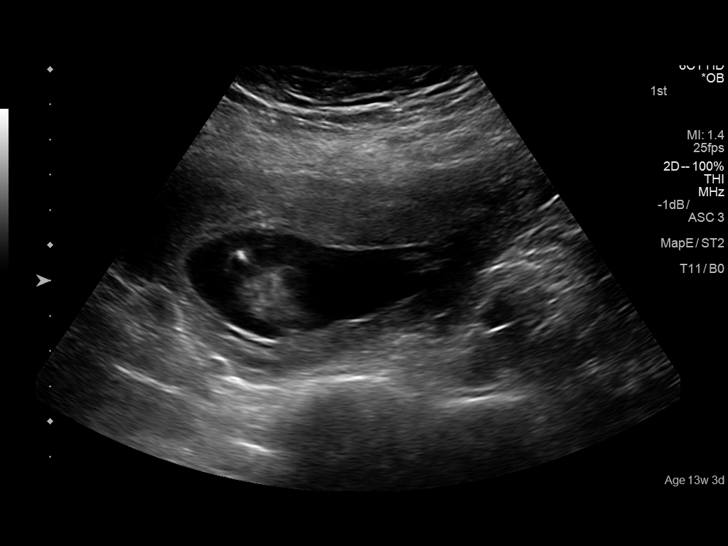
[im 8/41]
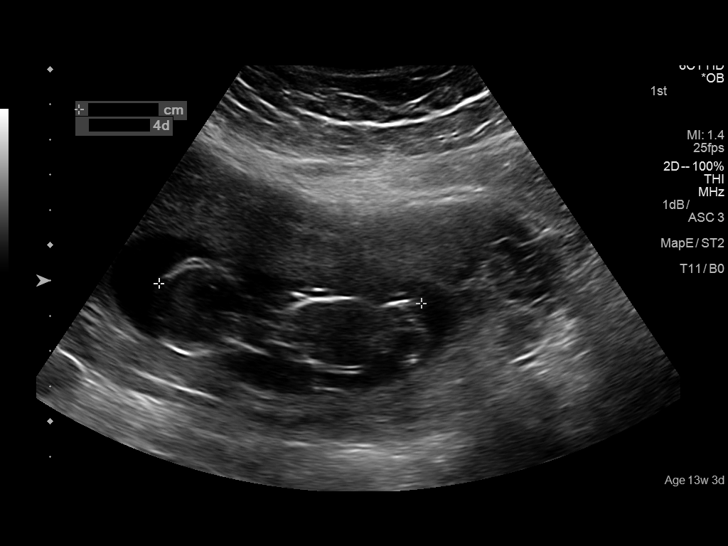
[im 11/41]
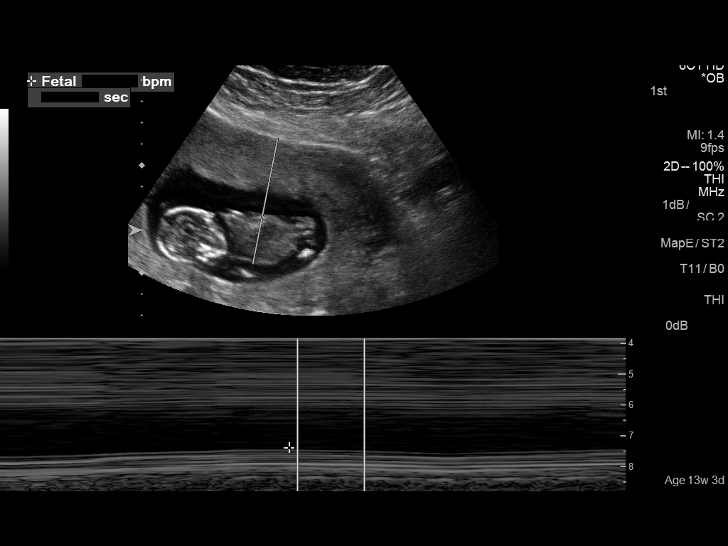
[im 14/41]
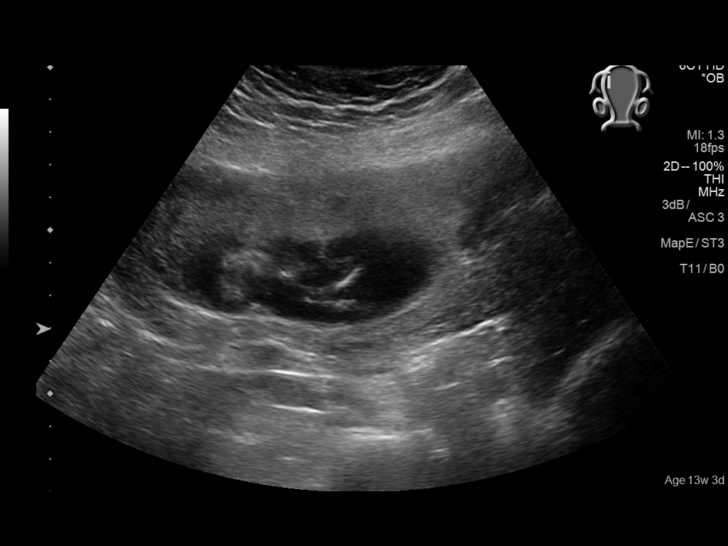
[im 17/41]
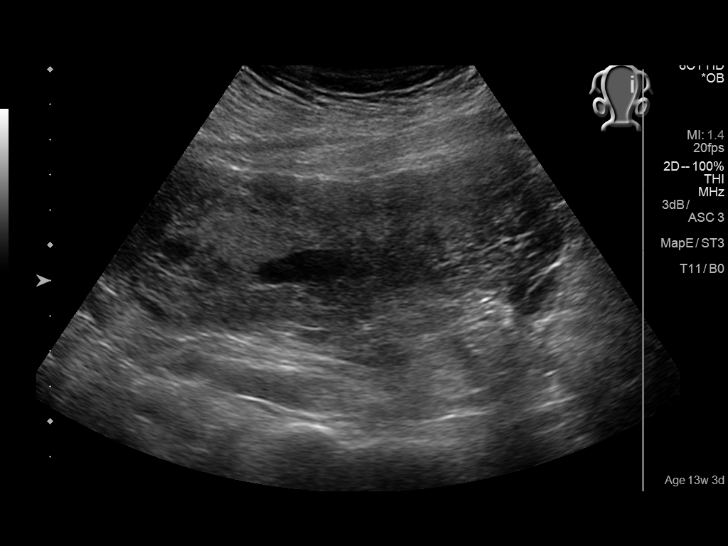
[im 20/41]
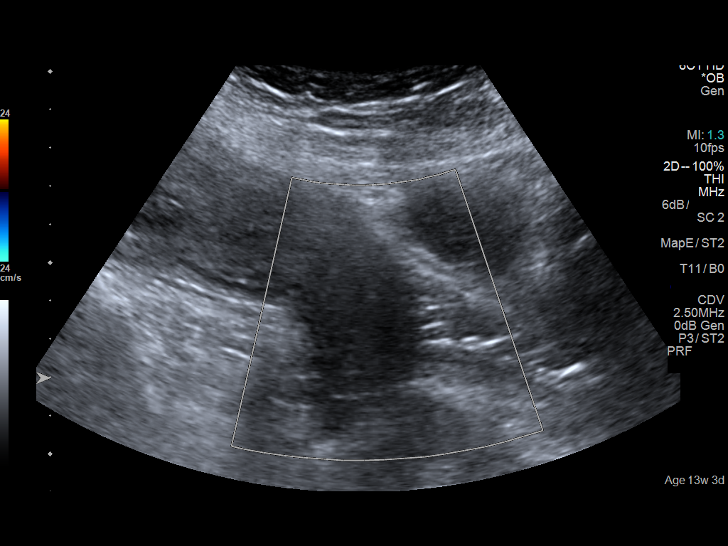
[im 23/41]
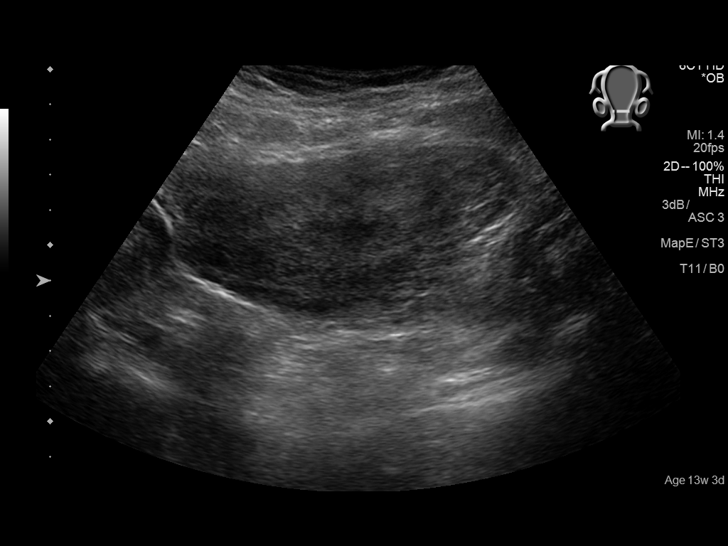
[im 26/41]
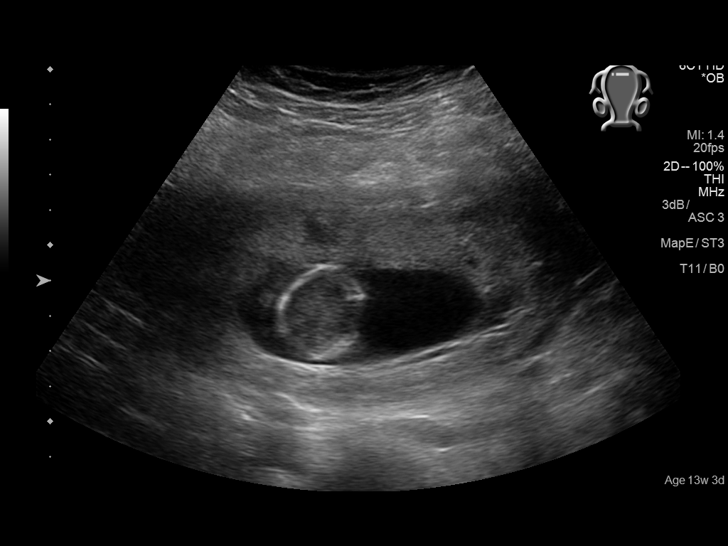
[im 29/41]
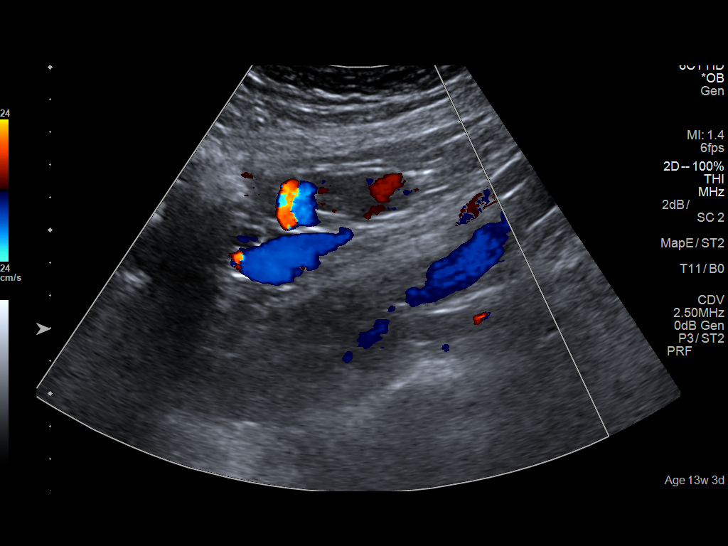
[im 32/41]
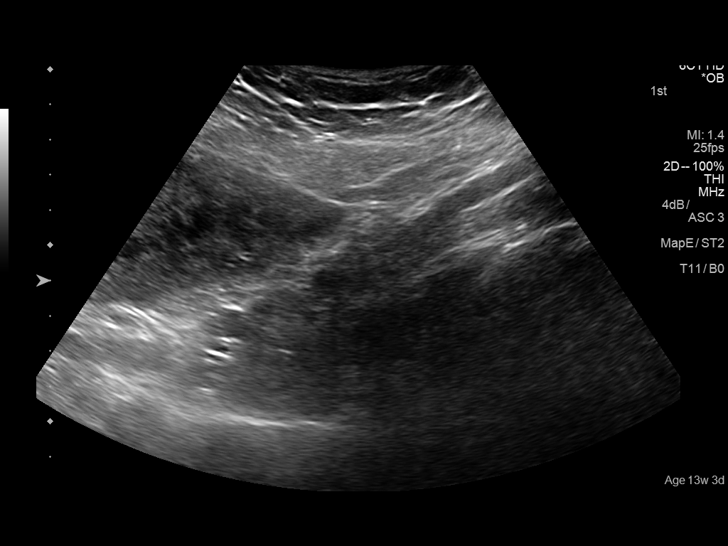
[im 35/41]
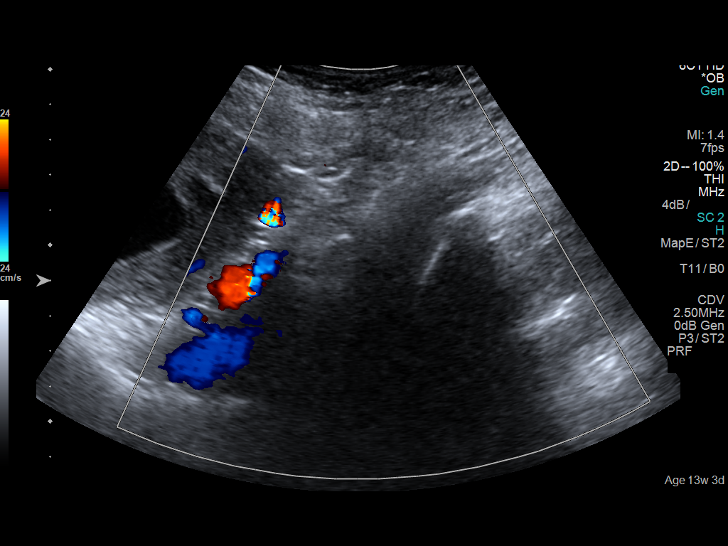
[im 38/41]
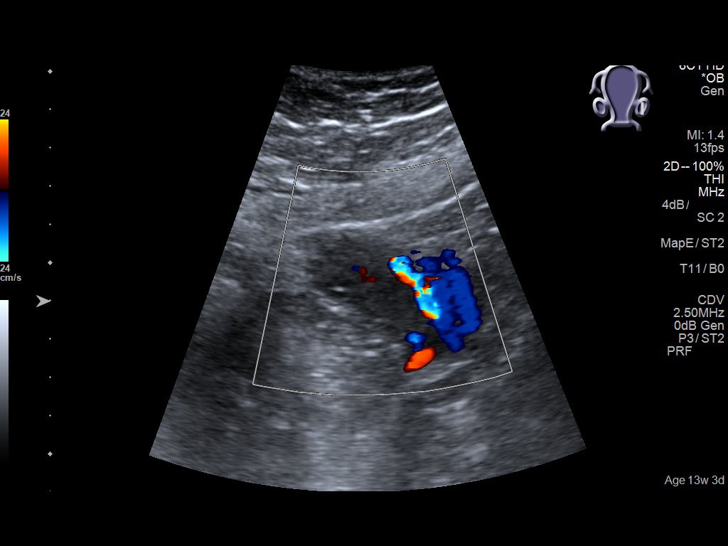
[im 41/41]
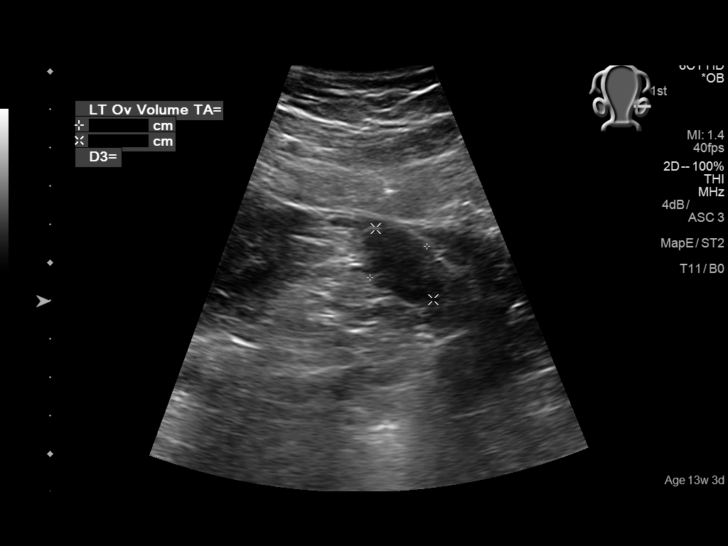

[14 of 28 positions shown; findings below may reference images not displayed]

FINDINGS: Intrauterine gestational sac: Single

Yolk sac:  Not visible

Embryo:  Visible

Cardiac Activity: Detected

Heart Rate: 153 bpm

CRL:   74.8  mm   13 w 4 d                  US EDC: 05/13/2016

Subchorionic hemorrhage:  None visualized.

Maternal uterus/adnexae: The left ovary appears normal measuring
x 2.4 x 3.3 cm. The right ovary is not visualized. No pelvic free
fluid.
IMPRESSION: Single living IUP demonstrated. No acute maternal findings
visualized.

## 2019-02-19 ENCOUNTER — Ambulatory Visit: Payer: Medicaid Other | Attending: Internal Medicine

## 2019-02-19 DIAGNOSIS — Z20822 Contact with and (suspected) exposure to covid-19: Secondary | ICD-10-CM

## 2019-02-21 LAB — NOVEL CORONAVIRUS, NAA: SARS-CoV-2, NAA: DETECTED — AB

## 2020-04-10 ENCOUNTER — Encounter: Payer: Self-pay | Admitting: Emergency Medicine

## 2020-04-10 ENCOUNTER — Other Ambulatory Visit: Payer: Self-pay

## 2020-04-10 ENCOUNTER — Ambulatory Visit
Admission: EM | Admit: 2020-04-10 | Discharge: 2020-04-10 | Disposition: A | Discharge status: Home / Self Care | Payer: Medicaid Other

## 2020-04-10 DIAGNOSIS — R21 Rash and other nonspecific skin eruption: Secondary | ICD-10-CM | POA: Diagnosis not present

## 2020-04-10 MED ORDER — PREDNISONE 10 MG PO TABS: ORAL_TABLET | ORAL | 0 refills | Status: DC

## 2020-04-10 NOTE — ED Triage Notes (Signed)
Patient in today c/o rash on her back and arms x 3 days. Patient states she started Clindamycin on 04/03/20. Patient states she also started using a different laundry mat ~1 week ago. Patient took a Benadryl and states that seemed to help a little bit.

## 2020-04-10 NOTE — Discharge Instructions (Addendum)
Stop the clindamycin.  You only have about a day and a half left anyway and your tooth looks fine.  This could be some sort of reaction to the clindamycin, but I am not sure.  You can continue taking Benadryl 1 to 2 tablets every 6 hours as needed for any itching and to help with the reaction if it is due to allergy.  I sent prednisone which should also help.  If for some reason you continue to have a spreading rash or you develop a fever or pain in any of these areas then you need to be seen again.  It is possible that this could be a infection of the skin if you are not improving.  Follow-up with your primary care or our department as needed.

## 2020-04-10 NOTE — ED Provider Notes (Signed)
[facility]    CSN: [id number] Arrival date & time: 04/10/20  1716      History   Chief Complaint Chief Complaint  Patient presents with  . Rash  . Allergic Reaction    HPI Wendy Bishop is a 30 y.o. female presenting for erythematous papules and pustules affecting the chest, abdomen, neck and back as well as the upper extremities x3 days.  She says they do not really itch and are not painful.  Denies any fever or fatigue.  No associated facial swelling, throat tightness or breathing difficulty.  Patient says that she has been taking clindamycin for the past week for a dental infection.  She says that she has not taken the clindamycin today and has taken Benadryl and symptoms do seem to be a little better.  She denies any similar problems in the past.  Additionally, she does note that she has gone to a different laundromat do her laundry for the past week.  She denies any family members or close contacts with similar symptoms.  No other concerns.  HPI  Past Medical History:  Diagnosis Date  . Abnormal Pap smear of cervix   . Complication of anesthesia    pt had nausea with her 2nd C-section  . High-risk pregnancy   . Premature delivery     Patient Active Problem List   Diagnosis Date Noted  . Status post cesarean delivery 05/11/2016  . Previous cesarean section complicating pregnancy, antepartum condition or complication 04/07/2016  . Tobacco use 04/07/2016  . Previous preterm delivery in second trimester, antepartum 04/07/2016    Past Surgical History:  Procedure Laterality Date  . CESAREAN SECTION    . CESAREAN SECTION WITH BILATERAL TUBAL LIGATION N/A 05/11/2016   Procedure: CESAREAN SECTION WITH BILATERAL TUBAL LIGATION;  Surgeon: Conard Novak, MD;  Location: [facility] ORS;  Service: Obstetrics;  Laterality: N/A;  Baby Boy born @ 1 Apgars: 8/9 Weight: 7lb 8oz  . CHOLECYSTECTOMY    . FRACTURE SURGERY Left 2013   5th toes fracture wire placed     OB History    Gravida  4   Para  4   Term  3   Preterm  1   AB      Living  3     SAB      IAB      Ectopic      Multiple  0   Live Births  4            Home Medications    Prior to Admission medications   Medication Sig Start Date End Date Taking? Authorizing Provider  clindamycin (CLEOCIN) 300 MG capsule Take 1 capsule by mouth every 6 (six) hours. 04/03/20  Yes [provider]  predniSONE (DELTASONE) 10 MG tablet Take 6 tablets on the first day and decrease by 1 tablet daily for the next 5 days 04/10/20  Yes Eusebio Friendly B, PA-C  ibuprofen (ADVIL,MOTRIN) 600 MG tablet Take 1 tablet (600 mg total) by mouth every 6 (six) hours as needed for headache, mild pain, moderate pain or cramping. Patient not taking: No sig reported 05/13/16   Farrel Conners, CNM  oxyCODONE-acetaminophen (PERCOCET/ROXICET) 5-325 MG tablet Take 1-2 tablets by mouth every 6 (six) hours as needed for moderate pain or severe pain (pain score 4-7/10). Patient not taking: No sig reported 05/13/16   Farrel Conners, CNM  Prenatal-DSS-FeCb-FeGl-FA (CITRANATAL BLOOM) 90-1 MG TABS Take 1 tablet by mouth daily at 2 PM. Patient  not taking: No sig reported 05/13/16   Farrel Conners, CNM    Family History Family History  Problem Relation Age of Onset  . Healthy Mother   . Healthy Father     Social History Social History   Tobacco Use  . Smoking status: Current Every Day Smoker    Packs/day: 0.50    Years: 10.00    Pack years: 5.00    Types: Cigarettes  . Smokeless tobacco: Never Used  Vaping Use  . Vaping Use: Never used  Substance Use Topics  . Alcohol use: No  . Drug use: No     Allergies   Patient has no known allergies.   Review of Systems Review of Systems  Constitutional: Negative for fatigue and fever.  HENT: Negative for facial swelling, trouble swallowing and voice change.   Respiratory: Negative for chest tightness, shortness of breath and wheezing.    Gastrointestinal: Negative for nausea and vomiting.  Musculoskeletal: Negative for myalgias.  Skin: Positive for rash.  Neurological: Negative for dizziness, weakness and headaches.     Physical Exam Triage Vital Signs ED Triage Vitals  Enc Vitals Group     BP      Pulse      Resp      Temp      Temp src      SpO2      Weight      Height      Head Circumference      Peak Flow      Pain Score      Pain Loc      Pain Edu?      Excl. in GC?    No data found.  Updated Vital Signs BP 130/78 (BP Location: Left Arm)   Pulse 68   Temp 98.6 F (37 C) (Oral)   Resp 18   Ht 5\' 4"  (1.626 m)   Wt 190 lb (86.2 kg)   LMP 03/20/2020 (Approximate)   SpO2 100%   BMI 32.61 kg/m    Physical Exam Vitals and nursing note reviewed.  Constitutional:      General: She is not in acute distress.    Appearance: Normal appearance. She is not ill-appearing or toxic-appearing.  HENT:     Head: Normocephalic and atraumatic.     Nose: Nose normal.     Mouth/Throat:     Mouth: Mucous membranes are moist.     Pharynx: Oropharynx is clear.  Eyes:     General: No scleral icterus.       Right eye: No discharge.        Left eye: No discharge.     Conjunctiva/sclera: Conjunctivae normal.  Cardiovascular:     Rate and Rhythm: Normal rate and regular rhythm.     Heart sounds: Normal heart sounds.  Pulmonary:     Effort: Pulmonary effort is normal. No respiratory distress.     Breath sounds: Normal breath sounds.  Musculoskeletal:     Cervical back: Neck supple.  Skin:    General: Skin is dry.     Findings: Rash (generalized rash affecting chest, abdomen, upper extremities and back) present. Rash is papular and pustular.  Neurological:     General: No focal deficit present.     Mental Status: She is alert. Mental status is at baseline.     Motor: No weakness.     Gait: Gait normal.  Psychiatric:        Mood and Affect: Mood normal.  Behavior: Behavior normal.        Thought  Content: Thought content normal.      UC Treatments / Results  Labs (all labs ordered are listed, but only abnormal results are displayed) Labs Reviewed - No data to display  EKG   Radiology No results found.  Procedures Procedures (including critical care time)  Medications Ordered in UC Medications - No data to display  Initial Impression / Assessment and Plan / UC Course  I have reviewed the triage vital signs and the nursing notes.  Pertinent labs & imaging results that were available during my care of the patient were reviewed by me and considered in my medical decision making (see chart for details).  30 year old female presenting for rash affecting upper body x3 days.  Has improved somewhat with taking Benadryl and stopping the clindamycin.  Exam consistent with numerous generalized erythematous papules and pustules of the chest, upper abdomen, bilateral upper extremities, and back.  Rash is nontender.  Rest of the exam is normal.  Suspect possible allergy or hypersensitivity to the clindamycin.  Advised her not to take the last 1.5 days of the medication.  Advised to continue the Benadryl since it seemed to have helped.  I did send prednisone as well.  Advised patient to follow-up with PCP or urgent care in the next couple of days for recheck.  Advised to follow-up sooner if she continues to have spreading rash or if any symptoms acutely worsen.  There is a possibility that this could be a secondary skin infection and she may need an antibiotic if the rash spreads or worsens in another way.  Patient is understanding and agreeable.  ED precautions discussed.  Final Clinical Impressions(s) / UC Diagnoses   Final diagnoses:  Rash and nonspecific skin eruption     Discharge Instructions     Stop the clindamycin.  You only have about a day and a half left anyway and your tooth looks fine.  This could be some sort of reaction to the clindamycin, but I am not sure.  You can  continue taking Benadryl 1 to 2 tablets every 6 hours as needed for any itching and to help with the reaction if it is due to allergy.  I sent prednisone which should also help.  If for some reason you continue to have a spreading rash or you develop a fever or pain in any of these areas then you need to be seen again.  It is possible that this could be a infection of the skin if you are not improving.  Follow-up with your primary care or our department as needed.    ED Prescriptions    Medication Sig Dispense Auth. Provider   predniSONE (DELTASONE) 10 MG tablet Take 6 tablets on the first day and decrease by 1 tablet daily for the next 5 days 21 tablet Shirlee Latch, PA-C     PDMP not reviewed this encounter.   Shirlee Latch, PA-C 04/10/20 1814

## 2020-05-23 ENCOUNTER — Emergency Department
Admission: EM | Admit: 2020-05-23 | Discharge: 2020-05-23 | Disposition: A | Discharge status: Home / Self Care | Payer: Medicaid Other | Attending: Emergency Medicine | Admitting: Emergency Medicine

## 2020-05-23 ENCOUNTER — Encounter: Payer: Self-pay | Admitting: Emergency Medicine

## 2020-05-23 ENCOUNTER — Ambulatory Visit
Admission: EM | Admit: 2020-05-23 | Discharge: 2020-05-23 | Disposition: A | Discharge status: Home / Self Care | Payer: Medicaid Other | Attending: Emergency Medicine | Admitting: Emergency Medicine

## 2020-05-23 ENCOUNTER — Emergency Department: Payer: Medicaid Other

## 2020-05-23 ENCOUNTER — Other Ambulatory Visit: Payer: Self-pay

## 2020-05-23 DIAGNOSIS — N760 Acute vaginitis: Secondary | ICD-10-CM | POA: Diagnosis not present

## 2020-05-23 DIAGNOSIS — R1031 Right lower quadrant pain: Secondary | ICD-10-CM | POA: Diagnosis not present

## 2020-05-23 DIAGNOSIS — F17210 Nicotine dependence, cigarettes, uncomplicated: Secondary | ICD-10-CM | POA: Insufficient documentation

## 2020-05-23 DIAGNOSIS — G8929 Other chronic pain: Secondary | ICD-10-CM | POA: Insufficient documentation

## 2020-05-23 DIAGNOSIS — R109 Unspecified abdominal pain: Secondary | ICD-10-CM | POA: Diagnosis present

## 2020-05-23 LAB — URINALYSIS, COMPLETE (UACMP) WITH MICROSCOPIC
Bacteria, UA: NONE SEEN
Bilirubin Urine: NEGATIVE
Bilirubin Urine: NEGATIVE
Glucose, UA: NEGATIVE mg/dL
Glucose, UA: NEGATIVE mg/dL
Ketones, ur: NEGATIVE mg/dL
Ketones, ur: NEGATIVE mg/dL
Nitrite: NEGATIVE
Nitrite: NEGATIVE
Protein, ur: NEGATIVE mg/dL
Protein, ur: NEGATIVE mg/dL
Specific Gravity, Urine: <1.005 — ABNORMAL LOW (ref 1.005–1.030)
Specific Gravity, Urine: 1.003 — ABNORMAL LOW (ref 1.005–1.030)
pH: 5.5 (ref 5.0–8.0)
pH: 6 (ref 5.0–8.0)

## 2020-05-23 LAB — CBC
HCT: 35.5 % — ABNORMAL LOW (ref 36.0–46.0)
Hemoglobin: 12.2 g/dL (ref 12.0–15.0)
MCH: 32.1 pg (ref 26.0–34.0)
MCHC: 34.4 g/dL (ref 30.0–36.0)
MCV: 93.4 fL (ref 80.0–100.0)
Platelets: 417 10*3/uL — ABNORMAL HIGH (ref 150–400)
RBC: 3.8 MIL/uL — ABNORMAL LOW (ref 3.87–5.11)
RDW: 12.7 % (ref 11.5–15.5)
WBC: 11.4 10*3/uL — ABNORMAL HIGH (ref 4.0–10.5)
nRBC: 0 % (ref 0.0–0.2)

## 2020-05-23 LAB — COMPREHENSIVE METABOLIC PANEL
ALT: 14 U/L (ref 0–44)
AST: 15 U/L (ref 15–41)
Albumin: 3.7 g/dL (ref 3.5–5.0)
Alkaline Phosphatase: 80 U/L (ref 38–126)
Anion gap: 8 (ref 5–15)
BUN: 7 mg/dL (ref 6–20)
CO2: 25 mmol/L (ref 22–32)
Calcium: 8.6 mg/dL — ABNORMAL LOW (ref 8.9–10.3)
Chloride: 105 mmol/L (ref 98–111)
Creatinine, Ser: 0.87 mg/dL (ref 0.44–1.00)
GFR, Estimated: >60 mL/min (ref >60)
Glucose, Bld: 92 mg/dL (ref 70–99)
Potassium: 3.4 mmol/L — ABNORMAL LOW (ref 3.5–5.1)
Sodium: 138 mmol/L (ref 135–145)
Total Bilirubin: 0.5 mg/dL (ref 0.3–1.2)
Total Protein: 6.9 g/dL (ref 6.5–8.1)

## 2020-05-23 LAB — WET PREP, GENITAL
Clue Cells Wet Prep HPF POC: NONE SEEN
Sperm: NONE SEEN
Trich, Wet Prep: NONE SEEN
Yeast Wet Prep HPF POC: NONE SEEN

## 2020-05-23 LAB — POC URINE PREG, ED: Preg Test, Ur: NEGATIVE

## 2020-05-23 LAB — LIPASE, BLOOD: Lipase: 38 U/L (ref 11–51)

## 2020-05-23 MED ORDER — IOHEXOL 300 MG/ML  SOLN
100.0000 mL | Freq: Once | INTRAMUSCULAR | Status: AC | PRN
  Administered 2020-05-23: 100 mL via INTRAVENOUS

## 2020-05-23 MED ORDER — CEFTRIAXONE SODIUM 1 G IJ SOLR
500.0000 mg | Freq: Once | INTRAMUSCULAR | Status: AC
  Administered 2020-05-23: 500 mg via INTRAMUSCULAR
  Filled 2020-05-23: qty 10

## 2020-05-23 MED ORDER — AZITHROMYCIN 500 MG PO TABS
1000.0000 mg | ORAL_TABLET | Freq: Once | ORAL | Status: AC
  Administered 2020-05-23: 1000 mg via ORAL
  Filled 2020-05-23: qty 2

## 2020-05-23 NOTE — ED Triage Notes (Signed)
C/O RLQ abdominal pain this morning.  States was seen through [facility], who referred patient to ED for evaluation to r/o appendicitis.  AAOx3.  Skin warm and dry. NAD. Posture upright and relaxed. NAD

## 2020-05-23 NOTE — ED Notes (Signed)
Patient is being discharged from the Urgent Care and sent to the Emergency Department via POV . Per Becky Augusta NP, patient is in need of higher level of care due to current symptoms and possible need for CT scan. Patient is aware and verbalizes understanding of plan of care.  Vitals:   05/23/20 1131  BP: 126/71  Pulse: 62  Resp: 14  Temp: 98.4 F (36.9 C)  SpO2: 98%

## 2020-05-23 NOTE — ED Provider Notes (Signed)
[facility]    CSN: [id number] Arrival date & time: 05/23/20  1108      History   Chief Complaint Chief Complaint  Patient presents with  . Back Pain    HPI Wendy Bishop is a 30 y.o. female.   HPI   30 year old female here for evaluation of right lower quadrant pain.  Patient reports that she has been experiencing right lower quadrant pain for last 2 to 3 days.  The pain radiates through to her back and it increases after she urinates.  Patient reports she is also had a decreased appetite.  Patient denies fever, painful urination, urinary urgency or frequency, blood in her urine, nausea, vomiting, diarrhea and states that the pain is neither worse nor better with eating food.  Patient has had her gallbladder removed but she still has her appendix.  Patient reports that her last normal bowel movement was yesterday.  She denies constipation.  Past Medical History:  Diagnosis Date  . Abnormal Pap smear of cervix   . Complication of anesthesia    pt had nausea with her 2nd C-section  . High-risk pregnancy   . Premature delivery     Patient Active Problem List   Diagnosis Date Noted  . Status post cesarean delivery 05/11/2016  . Previous cesarean section complicating pregnancy, antepartum condition or complication 04/07/2016  . Tobacco use 04/07/2016  . Previous preterm delivery in second trimester, antepartum 04/07/2016    Past Surgical History:  Procedure Laterality Date  . CESAREAN SECTION    . CESAREAN SECTION WITH BILATERAL TUBAL LIGATION N/A 05/11/2016   Procedure: CESAREAN SECTION WITH BILATERAL TUBAL LIGATION;  Surgeon: Conard Novak, MD;  Location: [facility] ORS;  Service: Obstetrics;  Laterality: N/A;  Baby Boy born @ 11 Apgars: 8/9 Weight: 7lb 8oz  . CHOLECYSTECTOMY    . FRACTURE SURGERY Left 2013   5th toes fracture wire placed    OB History    Gravida  4   Para  4   Term  3   Preterm  1   AB      Living  3     SAB      IAB       Ectopic      Multiple  0   Live Births  4            Home Medications    Prior to Admission medications   Medication Sig Start Date End Date Taking? Authorizing Provider  ibuprofen (ADVIL,MOTRIN) 600 MG tablet Take 1 tablet (600 mg total) by mouth every 6 (six) hours as needed for headache, mild pain, moderate pain or cramping. 05/13/16  Yes Farrel Conners, CNM    Family History Family History  Problem Relation Age of Onset  . Healthy Mother   . Healthy Father     Social History Social History   Tobacco Use  . Smoking status: Current Every Day Smoker    Packs/day: 0.50    Years: 10.00    Pack years: 5.00    Types: Cigarettes  . Smokeless tobacco: Never Used  Vaping Use  . Vaping Use: Never used  Substance Use Topics  . Alcohol use: No  . Drug use: No     Allergies   Patient has no known allergies.   Review of Systems Review of Systems  Constitutional: Positive for appetite change. Negative for activity change and fever.  Gastrointestinal: Positive for abdominal pain. Negative for blood in stool, constipation, nausea and  vomiting.  Genitourinary: Negative for dysuria, frequency, hematuria and urgency.  Musculoskeletal: Positive for back pain.  Skin: Negative for rash.  Hematological: Negative.   Psychiatric/Behavioral: Negative.      Physical Exam Triage Vital Signs ED Triage Vitals  Enc Vitals Group     BP 05/23/20 1131 126/71     Pulse Rate 05/23/20 1131 62     Resp 05/23/20 1131 14     Temp 05/23/20 1131 98.4 F (36.9 C)     Temp Source 05/23/20 1131 Oral     SpO2 05/23/20 1131 98 %     Weight 05/23/20 1128 195 lb (88.5 kg)     Height 05/23/20 1128 5\' 4"  (1.626 m)     Head Circumference --      Peak Flow --      Pain Score 05/23/20 1128 0     Pain Loc --      Pain Edu? --      Excl. in GC? --    No data found.  Updated Vital Signs BP 126/71 (BP Location: Left Arm)   Pulse 62   Temp 98.4 F (36.9 C) (Oral)   Resp 14    Ht 5\' 4"  (1.626 m)   Wt 195 lb (88.5 kg)   LMP 05/16/2020 (Approximate)   SpO2 98%   Breastfeeding No   BMI 33.47 kg/m   Visual Acuity Right Eye Distance:   Left Eye Distance:   Bilateral Distance:    Right Eye Near:   Left Eye Near:    Bilateral Near:     Physical Exam Vitals and nursing note reviewed.  Constitutional:      General: She is not in acute distress.    Appearance: Normal appearance. She is normal weight. She is not ill-appearing.  HENT:     Head: Normocephalic and atraumatic.  Cardiovascular:     Rate and Rhythm: Normal rate and regular rhythm.     Pulses: Normal pulses.     Heart sounds: Normal heart sounds. No murmur heard. No gallop.   Pulmonary:     Effort: Pulmonary effort is normal.     Breath sounds: Normal breath sounds. No wheezing, rhonchi or rales.  Abdominal:     General: Bowel sounds are normal. There is distension.     Palpations: Abdomen is soft.     Tenderness: There is abdominal tenderness. There is no right CVA tenderness, left CVA tenderness, guarding or rebound.  Skin:    General: Skin is warm and dry.     Capillary Refill: Capillary refill takes less than 2 seconds.     Findings: No erythema or rash.  Neurological:     General: No focal deficit present.     Mental Status: She is alert and oriented to person, place, and time.  Psychiatric:        Mood and Affect: Mood normal.        Behavior: Behavior normal.        Thought Content: Thought content normal.        Judgment: Judgment normal.      UC Treatments / Results  Labs (all labs ordered are listed, but only abnormal results are displayed) Labs Reviewed  URINALYSIS, COMPLETE (UACMP) WITH MICROSCOPIC - Abnormal; Notable for the following components:      Result Value   Specific Gravity, Urine <1.005 (*)    Hgb urine dipstick TRACE (*)    Leukocytes,Ua TRACE (*)    Bacteria, UA FEW (*)  All other components within normal limits    EKG   Radiology No results  found.  Procedures Procedures (including critical care time)  Medications Ordered in UC Medications - No data to display  Initial Impression / Assessment and Plan / UC Course  I have reviewed the triage vital signs and the nursing notes.  Pertinent labs & imaging results that were available during my care of the patient were reviewed by me and considered in my medical decision making (see chart for details).   Patient is a very pleasant 30 year old female here for evaluation of right lower quadrant abdominal pain that radiates through to her back and increases after she urinates.  This is been associated with a decreased appetite.  She has no other urinary symptoms such as painful urination, urinary urgency or frequency, and has not seen any blood in her urine.  She has no nausea, vomiting, diarrhea.  The pain does not improve or worsen with eating or movement.  Physical exam reveals a benign cardiopulmonary exam.  Abdomen is protuberant but soft with positive bowel sounds in all 4 quadrants.  Patient has tenderness in the right lower quadrant to palpation.  She has increased tenderness over McBurney's point without rebound or guarding.  There is a concern that this could be possible early appendicitis despite the lack of fever.  We are unable to obtain a prior authorization for a CT scan outpatient and we do not have CT scan today in the clinic.  Patient advised that she needs to be evaluated for possible appendicitis and that the only way to do that is to go to the emergency department.  Patient is opted to go to [facility] ER via POV.  Patient is stable.  UA was collected at triage.  There is trace hemoglobin, trace leukocytes, 6-10 squamous epithelials, and few bacteria.  Suspect the trace leukocytes are coming from the squamous epithelials and contaminated urine.  Will not send urine for culture.   Final Clinical Impressions(s) / UC Diagnoses   Final diagnoses:  Abdominal pain, chronic, right  lower quadrant     Discharge Instructions     Please go to the emergency department at [facility] as we discussed to be evaluated for possible appendicitis.    ED Prescriptions    None     PDMP not reviewed this encounter.   Becky Augusta, NP 05/23/20 1214

## 2020-05-23 NOTE — Discharge Instructions (Addendum)
Please go to the emergency department at [facility] as we discussed to be evaluated for possible appendicitis.

## 2020-05-23 NOTE — ED Triage Notes (Signed)
Patient c/o right lower back pain that started couple of days ago.  Patient denies dysuria or hematuria. Patient denies N/V/D.  Patient denies fevers or chills.

## 2020-05-23 NOTE — ED Notes (Signed)
Pt resting comfortably, talking on the phone. Denies needs. Bed locked in low position, side rails up x2, call light in reach.

## 2020-05-23 NOTE — ED Notes (Signed)
Chaperoned provider for pelvic exam.  

## 2020-05-23 NOTE — Discharge Instructions (Addendum)
Follow-up with your primary care provider or [facility] department if any continued problems with vaginal discharge.  An injection of antibiotics and also tablets were given to you while in the ED.  This should take care of any vaginal discharge and abdominal pain.  Your CT scan did not show any signs of appendicitis.

## 2020-05-23 NOTE — ED Provider Notes (Signed)
[facility] Emergency Department Provider Note  ____________________________________________   Event Date/Time   First MD Initiated Contact with Patient 05/23/20 1307     (approximate)  I have reviewed the triage vital signs and the nursing notes.   HISTORY  Chief Complaint Abdominal Pain   HPI Wendy Bishop is a 30 y.o. female presents to the ED with complaint of abdominal pain for last 3 days.  Patient reports that pain was worse after urination first thing this morning.  Patient denies any nausea, vomiting or diarrhea.  [facility] and was told to come to the ED for evaluation and imaging.  She denies any fever or chills.  Her last BM was last evening.  She last ate bacon at approximately 9:20 AM today.  She denies any urinary symptoms or vaginal discharge.  Currently she rates her pain as 0/10.         Past Medical History:  Diagnosis Date  . Abnormal Pap smear of cervix   . Complication of anesthesia    pt had nausea with her 2nd C-section  . High-risk pregnancy   . Premature delivery     Patient Active Problem List   Diagnosis Date Noted  . Status post cesarean delivery 05/11/2016  . Previous cesarean section complicating pregnancy, antepartum condition or complication 04/07/2016  . Tobacco use 04/07/2016  . Previous preterm delivery in second trimester, antepartum 04/07/2016    Past Surgical History:  Procedure Laterality Date  . CESAREAN SECTION    . CESAREAN SECTION WITH BILATERAL TUBAL LIGATION N/A 05/11/2016   Procedure: CESAREAN SECTION WITH BILATERAL TUBAL LIGATION;  Surgeon: Conard Novak, MD;  Location: [facility] ORS;  Service: Obstetrics;  Laterality: N/A;  Baby Boy born @ 64 Apgars: 8/9 Weight: 7lb 8oz  . CHOLECYSTECTOMY    . FRACTURE SURGERY Left 2013   5th toes fracture wire placed    Prior to Admission medications   Medication Sig Start Date End Date Taking? Authorizing Provider   ibuprofen (ADVIL,MOTRIN) 600 MG tablet Take 1 tablet (600 mg total) by mouth every 6 (six) hours as needed for headache, mild pain, moderate pain or cramping. 05/13/16   Farrel Conners, CNM    Allergies Patient has no known allergies.  Family History  Problem Relation Age of Onset  . Healthy Mother   . Healthy Father     Social History Social History   Tobacco Use  . Smoking status: Current Every Day Smoker    Packs/day: 0.50    Years: 10.00    Pack years: 5.00    Types: Cigarettes  . Smokeless tobacco: Never Used  Vaping Use  . Vaping Use: Never used  Substance Use Topics  . Alcohol use: No  . Drug use: No    Review of Systems Constitutional: No fever/chills Eyes: No visual changes. ENT: No sore throat. Cardiovascular: Denies chest pain. Respiratory: Denies shortness of breath. Gastrointestinal: Positive abdominal pain.  No nausea, no vomiting.  No diarrhea.  No constipation. Genitourinary: Negative for dysuria.  Negative for vaginal discharge. Musculoskeletal: Negative for back pain. Skin: Negative for rash. Neurological: Negative for headaches, focal weakness or numbness.  ____________________________________________   PHYSICAL EXAM:  VITAL SIGNS: ED Triage Vitals  Enc Vitals Group     BP 05/23/20 1306 132/83     Pulse Rate 05/23/20 1306 75     Resp 05/23/20 1306 16     Temp 05/23/20 1306 98.6 F (37 C)  Temp Source 05/23/20 1306 Oral     SpO2 --      Weight 05/23/20 1307 195 lb (88.5 kg)     Height 05/23/20 1307 5\' 4"  (1.626 m)     Head Circumference --      Peak Flow --      Pain Score 05/23/20 1306 0     Pain Loc --      Pain Edu? --      Excl. in GC? --    Constitutional: Alert and oriented. Well appearing and in no acute distress. Eyes: Conjunctivae are normal.  Head: Atraumatic. Nose: No congestion/rhinnorhea. Neck: No stridor.   Cardiovascular: Normal rate, regular rhythm. Grossly normal heart sounds.  Good peripheral  circulation. Respiratory: Normal respiratory effort.  No retractions. Lungs CTAB. Gastrointestinal: Soft with diffuse tenderness lower lumbar both right and left quadrants.  No epigastric or upper quadrant tenderness.  No distention. No CVA tenderness.  There is minimal rebound tenderness noted to the right and left lower quadrants.  Also tender McBurney's point subjectively. Musculoskeletal: No lower extremity tenderness nor edema.  No soft tissue edema noted lower extremities. Neurologic:  Normal speech and language. No gross focal neurologic deficits are appreciated. No gait instability. Skin:  Skin is warm, dry and intact. No rash noted. Psychiatric: Mood and affect are normal. Speech and behavior are normal.  ____________________________________________   LABS (all labs ordered are listed, but only abnormal results are displayed)  Labs Reviewed  WET PREP, GENITAL - Abnormal; Notable for the following components:      Result Value   WBC, Wet Prep HPF POC MANY (*)    All other components within normal limits  COMPREHENSIVE METABOLIC PANEL - Abnormal; Notable for the following components:   Potassium 3.4 (*)    Calcium 8.6 (*)    All other components within normal limits  CBC - Abnormal; Notable for the following components:   WBC 11.4 (*)    RBC 3.80 (*)    HCT 35.5 (*)    Platelets 417 (*)    All other components within normal limits  URINALYSIS, COMPLETE (UACMP) WITH MICROSCOPIC - Abnormal; Notable for the following components:   Color, Urine STRAW (*)    APPearance CLEAR (*)    Specific Gravity, Urine 1.003 (*)    Hgb urine dipstick SMALL (*)    Leukocytes,Ua MODERATE (*)    All other components within normal limits  LIPASE, BLOOD  URINALYSIS, COMPLETE (UACMP) WITH MICROSCOPIC  POC URINE PREG, ED   ____________________________________________  EKG ____________________________________________  RADIOLOGY 05/25/20, personally viewed and evaluated these  images (plain radiographs) as part of my medical decision making, as well as reviewing the written report by the radiologist.   Official radiology report(s): CT ABDOMEN PELVIS W CONTRAST  Result Date: 05/23/2020 CLINICAL DATA:  Right upper quadrant abdominal pain. EXAM: CT ABDOMEN AND PELVIS WITH CONTRAST TECHNIQUE: Multidetector CT imaging of the abdomen and pelvis was performed using the standard protocol following bolus administration of intravenous contrast. CONTRAST:  05/25/2020 OMNIPAQUE IOHEXOL 300 MG/ML  SOLN COMPARISON:  None. FINDINGS: Lower chest: Dependent hypoventilatory changes in the lung bases. No pleural fluid or acute airspace disease. Normal heart size. Hepatobiliary: Mild focal fatty infiltration adjacent to the falciform ligament. No suspicious liver lesion. Clips in the gallbladder fossa postcholecystectomy. No biliary dilatation. No visualized choledocholithiasis. Pancreas: No ductal dilatation or inflammation. Spleen: Normal in size without focal abnormality. Adrenals/Urinary Tract: Normal adrenal glands. No hydronephrosis or perinephric edema. Homogeneous  renal enhancement. No renal stones or focal lesion. Urinary bladder is physiologically distended without wall thickening. Stomach/Bowel: Normal appendix. Decompressed stomach. Unremarkable small bowel without inflammation or obstruction. Normal terminal ileum. Small volume of colonic stool. There is mild colonic redundancy. No colonic wall thickening or pericolonic inflammation. Vascular/Lymphatic: Normal caliber abdominal aorta. Patent portal vein. No acute vascular findings. No abdominopelvic adenopathy. Reproductive: Mildly heterogeneous uterus but no evidence of dominant fibroid by CT. Physiologic appearance of the ovaries. No adnexal mass. Trace pelvic free fluid. Other: No prior abdominal ascites. No free air or focal fluid collection. Small fat containing umbilical hernia. Musculoskeletal: There are no acute or suspicious osseous  abnormalities. Hemi transitional lumbosacral anatomy. IMPRESSION: 1. No acute abnormality in the abdomen/pelvis. No explanation for right-sided pain. 2. Post cholecystectomy without biliary dilatation. 3. Small fat containing umbilical hernia. Electronically Signed   By: Narda Rutherford M.D.   On: 05/23/2020 15:25    ____________________________________________   PROCEDURES  Procedure(s) performed (including Critical Care):  Procedures   ____________________________________________   INITIAL IMPRESSION / ASSESSMENT AND PLAN / ED COURSE  As part of my medical decision making, I reviewed the following data within the electronic MEDICAL RECORD NUMBER Notes from prior ED visits and [place] Controlled Substance Database  30 year old female presents to the ED with complaint of abdominal pain for the last 3 days.  Patient states that this was worse after urinating this morning.  She denies any nausea, vomiting or diarrhea.  There is been no fever or chills.  She was seen previously this [facility] at [facility] and told to come to the emergency department for imaging.  Urinalysis in the ED showed moderate WBCs, CBC showed elevation WBC at 11.4, wet prep showed many WBCs.  A GC and chlamydia test was added onto her urine.  Lipase was negative.  CT abdomen and pelvis was negative for any acute findings.  It was decided that patient would be empirically treated due to moderate amount of white cells seen in her wet prep.  Patient was given Rocephin 500 mg IM and Zithromax.  Patient is to follow-up with her PCP or also [facility] department is an option as well.  Patient is return to the emergency department if any severe worsening of her symptoms.  Acute vaginitis rule out GC and chlamydia. ____________________________________________   FINAL CLINICAL IMPRESSION(S) / ED DIAGNOSES  Final diagnoses:  Acute vaginitis     ED Discharge Orders    None      *Please note:  Wendy Bishop  was evaluated in Emergency Department on 05/23/2020 for the symptoms described in the history of present illness. She was evaluated in the context of the global COVID-19 pandemic, which necessitated consideration that the patient might be at risk for infection with the SARS-CoV-2 virus that causes COVID-19. Institutional protocols and algorithms that pertain to the evaluation of patients at risk for COVID-19 are in a state of rapid change based on information released by regulatory bodies including the CDC and federal and state organizations. These policies and algorithms were followed during the patient's care in the ED.  Some ED evaluations and interventions may be delayed as a result of limited staffing during and the pandemic.*   Note:  This document was prepared using Dragon voice recognition software and may include unintentional dictation errors.    Tommi Rumps, PA-C 05/23/20 2007    Concha Se, MD 05/29/20 [phone]

## 2020-06-03 ENCOUNTER — Encounter: Payer: Self-pay | Admitting: Advanced Practice Midwife

## 2020-06-03 ENCOUNTER — Other Ambulatory Visit: Payer: Self-pay

## 2020-06-03 ENCOUNTER — Ambulatory Visit: Payer: Medicaid Other | Admitting: Advanced Practice Midwife

## 2020-06-03 VITALS — BP 119/71 | Ht 64.0 in | Wt 195.6 lb

## 2020-06-03 DIAGNOSIS — Z9851 Tubal ligation status: Secondary | ICD-10-CM | POA: Insufficient documentation

## 2020-06-03 DIAGNOSIS — Z113 Encounter for screening for infections with a predominantly sexual mode of transmission: Secondary | ICD-10-CM | POA: Diagnosis not present

## 2020-06-03 DIAGNOSIS — L680 Hirsutism: Secondary | ICD-10-CM | POA: Insufficient documentation

## 2020-06-03 DIAGNOSIS — E66811 Obesity, class 1: Secondary | ICD-10-CM | POA: Insufficient documentation

## 2020-06-03 DIAGNOSIS — E669 Obesity, unspecified: Secondary | ICD-10-CM

## 2020-06-03 HISTORY — DX: Tubal ligation status: Z98.51

## 2020-06-03 LAB — WET PREP FOR TRICH, YEAST, CLUE
Trichomonas Exam: NEGATIVE
Yeast Exam: NEGATIVE

## 2020-06-03 NOTE — Progress Notes (Signed)
[facility] Department STI clinic/screening visit  Subjective:  Wendy Bishop is a 30 y.o.SWF G4P3 smoker female being seen today for an STI screening visit. The patient reports they do not have symptoms.  Patient reports that they do not desire a pregnancy in the next year.   They reported they are not interested in discussing contraception today.  Patient's last menstrual period was 05/16/2020 (approximate).   Patient has the following medical conditions:   Patient Active Problem List   Diagnosis Date Noted  . History of bilateral tubal ligation 2018 06/03/2020  . Tobacco use 1 ppd 04/07/2016    Chief Complaint  Patient presents with  . Annual Exam    PE, Pap and STI check    HPI  Patient reports BTL 2018. Here for physical, pap, std check. Informed pt we cannot do PE or pap if has had  BTL.  Asymptomatic. Last sex 05/13/20 without condom; with current partner x 1 year; 1 partner in last 3 mo. LMP 05/14/20. Last MJ age 82. Last ETOH 5 years ago. Smoking 1 ppd.  Last HIV test per patient/review of record was 05/10/16 Patient reports last pap was 4 years ago  See flowsheet for further details and programmatic requirements.    The following portions of the patient's history were reviewed and updated as appropriate: allergies, current medications, past medical history, past social history, past surgical history and problem list.  Objective:   Vitals:   06/03/20 1510  BP: 119/71  Weight: 195 lb 9.6 oz (88.7 kg)  Height: 5\' 4"  (1.626 m)    Physical Exam Vitals and nursing note reviewed.  Constitutional:      Appearance: Normal appearance. She is obese.  HENT:     Head: Normocephalic and atraumatic.     Comments: Negative cervical lymphadenopathy    Mouth/Throat:     Mouth: Mucous membranes are moist.     Pharynx: Oropharynx is clear. No oropharyngeal exudate or posterior oropharyngeal erythema.  Eyes:     Conjunctiva/sclera: Conjunctivae normal.   Pulmonary:     Effort: Pulmonary effort is normal.  Chest:  Breasts:     Right: No axillary adenopathy or supraclavicular adenopathy.     Left: No axillary adenopathy or supraclavicular adenopathy.    Abdominal:     Palpations: Abdomen is soft. There is no mass.     Tenderness: There is no abdominal tenderness. There is no rebound.     Comments: Soft without masses or tenderness  Genitourinary:    General: Normal vulva.     Exam position: Lithotomy position.     Pubic Area: No rash or pubic lice.      Labia:        Right: No rash or lesion.        Left: No rash or lesion.      Vagina: Vaginal discharge (small amount white creamy leukorrhea, ph<4.5) present. No erythema, bleeding or lesions.     Cervix: Normal.     Uterus: Normal.      Adnexa: Right adnexa normal and left adnexa normal.     Rectum: Normal.  Lymphadenopathy:     Head:     Right side of head: No preauricular or posterior auricular adenopathy.     Left side of head: No preauricular or posterior auricular adenopathy.     Cervical: No cervical adenopathy.     Upper Body:     Right upper body: No supraclavicular or axillary adenopathy.  Left upper body: No supraclavicular or axillary adenopathy.     Lower Body: No right inguinal adenopathy. No left inguinal adenopathy.  Skin:    General: Skin is warm and dry.     Findings: No rash.  Neurological:     Mental Status: She is alert and oriented to person, place, and time.      Assessment and Plan:  Wendy Bishop is a 30 y.o. female presenting to the [facility] Department for STI screening  1. History of bilateral tubal ligation 2018   2. Screening examination for venereal disease Treat wet mount per standing orders Immunization nurse consult - WET PREP FOR TRICH, YEAST, CLUE - Chlamydia/Gonorrhea [facility]     No follow-ups on file.  No future appointments.  Alberteen Spindle, CNM

## 2020-06-03 NOTE — Progress Notes (Signed)
Wet mount results reviewed.  No treatment required. Berdie Ogren, RN

## 2020-06-09 LAB — HM HIV SCREENING LAB: HM HIV Screening: NEGATIVE

## 2020-06-11 ENCOUNTER — Encounter: Payer: Self-pay | Admitting: Advanced Practice Midwife

## 2020-07-29 ENCOUNTER — Emergency Department: Payer: Medicaid Other

## 2020-07-29 ENCOUNTER — Emergency Department
Admission: EM | Admit: 2020-07-29 | Discharge: 2020-07-29 | Disposition: A | Discharge status: Home / Self Care | Payer: Medicaid Other | Attending: Emergency Medicine | Admitting: Emergency Medicine

## 2020-07-29 ENCOUNTER — Other Ambulatory Visit: Payer: Self-pay

## 2020-07-29 DIAGNOSIS — N12 Tubulo-interstitial nephritis, not specified as acute or chronic: Secondary | ICD-10-CM

## 2020-07-29 DIAGNOSIS — F17210 Nicotine dependence, cigarettes, uncomplicated: Secondary | ICD-10-CM | POA: Diagnosis not present

## 2020-07-29 DIAGNOSIS — R109 Unspecified abdominal pain: Secondary | ICD-10-CM | POA: Diagnosis present

## 2020-07-29 LAB — URINALYSIS, COMPLETE (UACMP) WITH MICROSCOPIC
Bilirubin Urine: NEGATIVE
Glucose, UA: NEGATIVE mg/dL
Ketones, ur: 5 mg/dL — AB
Nitrite: NEGATIVE
Protein, ur: >=300 mg/dL — AB
RBC / HPF: >50 RBC/hpf — ABNORMAL HIGH (ref 0–5)
Specific Gravity, Urine: 1.016 (ref 1.005–1.030)
WBC, UA: >50 WBC/hpf — ABNORMAL HIGH (ref 0–5)
pH: 5 (ref 5.0–8.0)

## 2020-07-29 LAB — COMPREHENSIVE METABOLIC PANEL
ALT: 15 U/L (ref 0–44)
AST: 15 U/L (ref 15–41)
Albumin: 3.8 g/dL (ref 3.5–5.0)
Alkaline Phosphatase: 110 U/L (ref 38–126)
Anion gap: 8 (ref 5–15)
BUN: 9 mg/dL (ref 6–20)
CO2: 27 mmol/L (ref 22–32)
Calcium: 8.9 mg/dL (ref 8.9–10.3)
Chloride: 103 mmol/L (ref 98–111)
Creatinine, Ser: 0.87 mg/dL (ref 0.44–1.00)
GFR, Estimated: >60 mL/min (ref >60)
Glucose, Bld: 112 mg/dL — ABNORMAL HIGH (ref 70–99)
Potassium: 3.6 mmol/L (ref 3.5–5.1)
Sodium: 138 mmol/L (ref 135–145)
Total Bilirubin: 0.7 mg/dL (ref 0.3–1.2)
Total Protein: 7.1 g/dL (ref 6.5–8.1)

## 2020-07-29 LAB — CBC
HCT: 40.5 % (ref 36.0–46.0)
Hemoglobin: 13.7 g/dL (ref 12.0–15.0)
MCH: 31.4 pg (ref 26.0–34.0)
MCHC: 33.8 g/dL (ref 30.0–36.0)
MCV: 92.9 fL (ref 80.0–100.0)
Platelets: 340 10*3/uL (ref 150–400)
RBC: 4.36 MIL/uL (ref 3.87–5.11)
RDW: 12.6 % (ref 11.5–15.5)
WBC: 19 10*3/uL — ABNORMAL HIGH (ref 4.0–10.5)
nRBC: 0 % (ref 0.0–0.2)

## 2020-07-29 LAB — LIPASE, BLOOD: Lipase: 34 U/L (ref 11–51)

## 2020-07-29 LAB — POC URINE PREG, ED: Preg Test, Ur: NEGATIVE

## 2020-07-29 MED ORDER — SULFAMETHOXAZOLE-TRIMETHOPRIM 800-160 MG PO TABS: 1.0000 | ORAL_TABLET | Freq: Two times a day (BID) | ORAL | 0 refills | Status: DC

## 2020-07-29 MED ORDER — IBUPROFEN 800 MG PO TABS
800.0000 mg | ORAL_TABLET | Freq: Once | ORAL | Status: AC
  Administered 2020-07-29: 800 mg via ORAL
  Filled 2020-07-29: qty 1

## 2020-07-29 MED ORDER — SODIUM CHLORIDE 0.9 % IV SOLN
1.0000 g | Freq: Once | INTRAVENOUS | Status: AC
  Administered 2020-07-29: 1 g via INTRAVENOUS
  Filled 2020-07-29: qty 10

## 2020-07-29 MED ORDER — ONDANSETRON HCL 4 MG/2ML IJ SOLN
4.0000 mg | Freq: Once | INTRAMUSCULAR | Status: AC
  Administered 2020-07-29: 4 mg via INTRAVENOUS
  Filled 2020-07-29: qty 2

## 2020-07-29 MED ORDER — SODIUM CHLORIDE 0.9 % IV BOLUS
1000.0000 mL | Freq: Once | INTRAVENOUS | Status: AC
  Administered 2020-07-29: 1000 mL via INTRAVENOUS

## 2020-07-29 MED ORDER — TRAMADOL HCL 50 MG PO TABS: 50.0000 mg | ORAL_TABLET | Freq: Four times a day (QID) | ORAL | 0 refills | Status: DC | PRN

## 2020-07-29 MED ORDER — ONDANSETRON 4 MG PO TBDP: 4.0000 mg | ORAL_TABLET | Freq: Three times a day (TID) | ORAL | 0 refills | Status: DC | PRN

## 2020-07-29 NOTE — ED Provider Notes (Signed)
[facility] Emergency Department Provider Note ____________________________________________   Event Date/Time   First MD Initiated Contact with Patient 07/29/20 1802     (approximate)  I have reviewed the triage vital signs and the nursing notes.   HISTORY  Chief Complaint Emesis  HPI Wendy Bishop is a 30 y.o. female with no significant PMH presents to the emergency department for treatment and evaluation of nausea, vomiting, and diarrhea with subjective fever that all started this am. She is also having dysuria and had some pain in the right flank yesterday. 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         Past Medical History:  Diagnosis Date   Abnormal Pap smear of cervix    Complication of anesthesia    pt had nausea with her 2nd C-section   High-risk pregnancy    Premature delivery     Patient Active Problem List   Diagnosis Date Noted   History of bilateral tubal ligation 2018 06/03/2020   Obesity BMI=33.5 06/03/2020   Hirsutism 06/03/2020   Tobacco use 1 ppd 04/07/2016    Past Surgical History:  Procedure Laterality Date   CESAREAN SECTION     CESAREAN SECTION WITH BILATERAL TUBAL LIGATION N/A 05/11/2016   Procedure: CESAREAN SECTION WITH BILATERAL TUBAL LIGATION;  Surgeon: Conard Novak, MD;  Location: [facility] ORS;  Service: Obstetrics;  Laterality: N/A;  Baby Boy born @ 53 Apgars: 8/9 Weight: 7lb 8oz   CHOLECYSTECTOMY     FRACTURE SURGERY Left 2013   5th toes fracture wire placed    Prior to Admission medications   Medication Sig Start Date End Date Taking? Authorizing Provider  ondansetron (ZOFRAN-ODT) 4 MG disintegrating tablet Take 1 tablet (4 mg total) by mouth every 8 (eight) hours as needed for nausea or vomiting. 07/29/20  Yes Ossie Beltran B, FNP  sulfamethoxazole-trimethoprim (BACTRIM DS) 800-160 MG tablet Take 1 tablet by mouth 2 (two) times daily. 07/29/20  Yes Elizet Kaplan B, FNP  traMADol  (ULTRAM) 50 MG tablet Take 1 tablet (50 mg total) by mouth every 6 (six) hours as needed. 07/29/20  Yes Margalit Leece, Kasandra Knudsen, FNP    Allergies Patient has no known allergies.  Family History  Problem Relation Age of Onset   Healthy Mother    Healthy Father     Social History Social History   Tobacco Use   Smoking status: Every Day    Packs/day: 0.50    Years: 10.00    Pack years: 5.00    Types: Cigarettes   Smokeless tobacco: Never  Vaping Use   Vaping Use: Never used  Substance Use Topics   Alcohol use: No   Drug use: No    Review of Systems  Constitutional: No fever/chills Eyes: No visual changes. ENT: No sore throat. Cardiovascular: Denies chest pain. Respiratory: Denies shortness of breath. Gastrointestinal: No abdominal pain.  No nausea, no vomiting.  No diarrhea.  No constipation. Genitourinary: Negative for dysuria. Musculoskeletal: Negative for back pain. Skin: Negative for rash. Neurological: Negative for headaches, focal weakness or numbness. ____________________________________________   PHYSICAL EXAM:  VITAL SIGNS: ED Triage Vitals  Enc Vitals Group     BP 07/29/20 1624 129/79     Pulse Rate 07/29/20 1624 (!) 102     Resp 07/29/20 1624 18     Temp 07/29/20 1624 98.7 F (37.1 C)     Temp Source 07/29/20 1624 Oral     SpO2 07/29/20 1624 100 %  Weight 07/29/20 1625 190 lb (86.2 kg)     Height 07/29/20 1625 5\' 4"  (1.626 m)     Head Circumference --      Peak Flow --      Pain Score 07/29/20 1625 0     Pain Loc --      Pain Edu? --      Excl. in GC? --     Constitutional: Alert and oriented. Well appearing and in no acute distress. Eyes: Conjunctivae are normal. PERRL. EOMI. Head: Atraumatic. Nose: No congestion/rhinnorhea. Mouth/Throat: Mucous membranes are moist.  Oropharynx non-erythematous. Neck: No stridor.   Hematological/Lymphatic/Immunilogical: No cervical lymphadenopathy. Cardiovascular: Normal rate, regular rhythm. Grossly normal  heart sounds.  Good peripheral circulation. Respiratory: Normal respiratory effort.  No retractions. Lungs CTAB. Gastrointestinal: Soft and nontender. No distention. No abdominal bruits. No CVA tenderness. Genitourinary:  Musculoskeletal: No lower extremity tenderness nor edema.  No joint effusions. Neurologic:  Normal speech and language. No gross focal neurologic deficits are appreciated. No gait instability. Skin:  Skin is warm, dry and intact. No rash noted. Psychiatric: Mood and affect are normal. Speech and behavior are normal.  ____________________________________________   LABS (all labs ordered are listed, but only abnormal results are displayed)  Labs Reviewed  COMPREHENSIVE METABOLIC PANEL - Abnormal; Notable for the following components:      Result Value   Glucose, Bld 112 (*)    All other components within normal limits  CBC - Abnormal; Notable for the following components:   WBC 19.0 (*)    All other components within normal limits  URINALYSIS, COMPLETE (UACMP) WITH MICROSCOPIC - Abnormal; Notable for the following components:   Color, Urine YELLOW (*)    APPearance TURBID (*)    Hgb urine dipstick LARGE (*)    Ketones, ur 5 (*)    Protein, ur >=300 (*)    Leukocytes,Ua LARGE (*)    RBC / HPF >50 (*)    WBC, UA >50 (*)    Bacteria, UA MANY (*)    All other components within normal limits  LIPASE, BLOOD  POC URINE PREG, ED   ____________________________________________  EKG  Not indicated. ____________________________________________  RADIOLOGY  ED MD interpretation:    CT of the abdomen pelvis negative for stone disease.  There is however evidence of acute cystitis and pyelonephritis. I, Kem Boroughsari Aasia Peavler, personally viewed and evaluated these images (plain radiographs) as part of my medical decision making, as well as reviewing the written report by the radiologist.  Official radiology report(s): CT Renal Stone Study  Result Date: 07/29/2020 CLINICAL  DATA:  Body aches, subjective fevers, chills, nausea and vomiting. Symptoms when patient woke. History of cholecystectomy and prior Caesarean EXAM: CT ABDOMEN AND PELVIS WITHOUT CONTRAST TECHNIQUE: Multidetector CT imaging of the abdomen and pelvis was performed following the standard protocol without IV contrast. COMPARISON:  CT 05/23/2020 FINDINGS: Lower chest: Lung bases are clear. Normal heart size. No pericardial effusion. Hepatobiliary: No visible focal liver lesion with limitations of an unenhanced CT. Normal hepatic attenuation. Smooth liver surface contour. Prior cholecystectomy. No visible intraductal gallstones or significant biliary ductal dilatation. Pancreas: No pancreatic ductal dilatation or surrounding inflammatory changes. Spleen: Normal in size. No concerning splenic lesions. Adrenals/Urinary Tract: Normal adrenal glands. No visible or contour deforming renal lesions. Asymmetric right perinephric and periureteral stranding to the level of a circumferentially thickened urinary bladder. No visible urolithiasis or frank hydronephrosis. No left urinary tract dilatation or significant perinephric or periureteral stranding. No visible bladder calculi or  debris. No visible calcifications along the expected course of the urethra. Stomach/Bowel: Distal esophagus, stomach and duodenal sweep are unremarkable. No small bowel wall thickening or dilatation. No colonic dilatation or wall thickening. Noninflamed appendix in the right lower quadrant and retrocecal position. No evidence of bowel obstruction. Vascular/Lymphatic: No significant vascular findings are present. No enlarged abdominal or pelvic lymph nodes. Reproductive: Anteverted uterus. No concerning adnexal masses or lesions. Other: Mild ventral diastasis. No bowel containing hernia. Postsurgical changes the anterior abdominal wall compatible with prior Caesarean. No body wall or retroperitoneal hematoma. No free fluid or air. Musculoskeletal: No  acute osseous abnormality or suspicious osseous lesion. Transitional lumbosacral vertebrae denoted as L6 as well as and unfused left L1 transverse process versus rudimentary rib. IMPRESSION: 1. Asymmetric right perinephric and periureteral stranding with mild bladder wall thickening. Findings could reflect a cystitis and ascending tract infection with pyelonephritis. Recently passed calculus is possible though less favored. Correlate with urinalysis. 2. Prior cholecystectomy. 3. Tiny fat containing umbilical hernia and mild rectus diastasis. 4. Transitional thoracolumbosacral anatomy as above. Electronically Signed   By: Kreg Shropshire M.D.   On: 07/29/2020 19:43    ____________________________________________   PROCEDURES  Procedure(s) performed (including Critical Care):  Procedures  ____________________________________________   INITIAL IMPRESSION / ASSESSMENT AND PLAN     30 year old female presenting to the emergency department for treatment and evaluation of nausea and vomiting with dysuria and right flank pain.  See HPI for further details.  Plan is to await protocol labs drawn while awaiting ER room assignment.  DIFFERENTIAL DIAGNOSIS  Acute cystitis, pyelonephritis, gastroenteritis, COVID, influenza  ED COURSE  Mild leukocytosis at 19.  Indication of an acute cystitis based on urinalysis.  Plan will be to get a CT of the abdomen and pelvis to rule out infected stone and to determine if there is a degree of pyelonephritis.  Pyelonephritis confirmed on CT.  Patient received 1 g of ceftriaxone while here in the emergency department and will be discharged home on Bactrim.  She will also be given some Zofran and tramadol.  She is to follow-up with her primary care provider and 10 to 14 days for repeat urinalysis.  She was encouraged to return to the emergency department for symptoms change or worsen if she is unable to schedule an appointment.     ___________________________________________   FINAL CLINICAL IMPRESSION(S) / ED DIAGNOSES  Final diagnoses:  Pyelonephritis     ED Discharge Orders          Ordered    sulfamethoxazole-trimethoprim (BACTRIM DS) 800-160 MG tablet  2 times daily        07/29/20 2015    ondansetron (ZOFRAN-ODT) 4 MG disintegrating tablet  Every 8 hours PRN        07/29/20 2015    traMADol (ULTRAM) 50 MG tablet  Every 6 hours PRN        07/29/20 2015             Wendy Bishop was evaluated in Emergency Department on 07/29/2020 for the symptoms described in the history of present illness. She was evaluated in the context of the global COVID-19 pandemic, which necessitated consideration that the patient might be at risk for infection with the SARS-CoV-2 virus that causes COVID-19. Institutional protocols and algorithms that pertain to the evaluation of patients at risk for COVID-19 are in a state of rapid change based on information released by regulatory bodies including the CDC and federal and state organizations. These policies and  algorithms were followed during the patient's care in the ED.   Note:  This document was prepared using Dragon voice recognition software and may include unintentional dictation errors.    Chinita Pester, FNP 07/29/20 2204    Delton Prairie, MD 07/29/20 [phone]

## 2020-07-29 NOTE — Discharge Instructions (Addendum)
Please follow-up with your primary care provider in about 10 to 14 days or sooner if symptoms do not seem to be improving with medication.  Return to the emergency department for symptoms of change or worsen if you are unable to schedule an appointment.

## 2020-07-29 NOTE — ED Notes (Signed)
See triage note   States she woke up with body aches ,subjective fever and n/v this am  Afebrile on arrival last time vomited was approx 1 hr ago

## 2020-07-29 NOTE — ED Triage Notes (Signed)
Pt to ED POV for emesis, body aches and fever that started this am Pt ambulatory to triage, NAD noted

## 2021-05-25 ENCOUNTER — Ambulatory Visit: Payer: Medicaid Other | Admitting: Nurse Practitioner

## 2021-05-25 ENCOUNTER — Encounter: Payer: Self-pay | Admitting: Nurse Practitioner

## 2021-05-25 ENCOUNTER — Ambulatory Visit: Payer: Medicaid Other

## 2021-05-25 DIAGNOSIS — Z113 Encounter for screening for infections with a predominantly sexual mode of transmission: Secondary | ICD-10-CM

## 2021-05-25 LAB — WET PREP FOR TRICH, YEAST, CLUE
Trichomonas Exam: NEGATIVE
Yeast Exam: NEGATIVE

## 2021-05-25 LAB — HM HIV SCREENING LAB: HM HIV Screening: NEGATIVE

## 2021-05-25 NOTE — Progress Notes (Signed)
[facility] Department ? ?STI clinic/screening visit ?[place] [address] ?[phone] ? ?Subjective:  ?Wendy Bishop is a 31 y.o. female being seen today for an STI screening visit. The patient reports they do have symptoms.  Patient reports that they do not desire a pregnancy in the next year.   They reported they are not interested in discussing contraception today.  Patient has a history of tubal ligation.   ? ?Patient's last menstrual period was 05/21/2021 (exact date). ? ? ?Patient has the following medical conditions:   ?Patient Active Problem List  ? Diagnosis Date Noted  ? History of bilateral tubal ligation 2018 06/03/2020  ? Obesity BMI=33.5 06/03/2020  ? Hirsutism 06/03/2020  ? Tobacco use 1 ppd 04/07/2016  ? ? ?Chief Complaint  ?Patient presents with  ? SEXUALLY TRANSMITTED DISEASE  ?  Screen  ? ? ?HPI ? ?Patient reports to clinic today for STD screening.  Patient reports pain with sex for one month.   ? ?Last HIV test per patient/review of record was 06/03/2020 ?Patient reports last pap was 5 years ago.  ? ?Screening for MPX risk: ?Does the patient have an unexplained rash? No ?Is the patient MSM? No ?Does the patient endorse multiple sex partners or anonymous sex partners? No ?Did the patient have close or sexual contact with a person diagnosed with MPX? No ?Has the patient traveled outside the [place] where MPX is endemic? No ?Is there a high clinical suspicion for MPX-- evidenced by one of the following No ? -Unlikely to be chickenpox ? -Lymphadenopathy ? -Rash that present in same phase of evolution on any given body part ?See flowsheet for further details and programmatic requirements.  ? ? ?The following portions of the patient's history were reviewed and updated as appropriate: allergies, current medications, past medical history, past social history, past surgical history and problem list. ? ?Objective:  ?There were no vitals filed for this  visit. ? ?Physical Exam ?Constitutional:   ?   Appearance: Normal appearance.  ?HENT:  ?   Head: Normocephalic.  ?   Right Ear: External ear normal.  ?   Left Ear: External ear normal.  ?   Nose: Nose normal.  ?   Mouth/Throat:  ?   Lips: Pink.  ?   Mouth: Mucous membranes are moist.  ?   Comments: No visible signs of dental caries.  ?Pulmonary:  ?   Effort: Pulmonary effort is normal.  ?Abdominal:  ?   General: Abdomen is flat.  ?   Palpations: Abdomen is soft.  ?Genitourinary: ?   Comments: External genitalia/pubic area without nits, lice, edema, erythema, lesions and inguinal adenopathy. ?Vagina with normal mucosa and discharge. ?Cervix without visible lesions. ?Uterus firm, mobile, nt, no masses, no CMT, no adnexal tenderness or fullness. pH > 4.5. ?Musculoskeletal:  ?   Cervical back: Full passive range of motion without pain, normal range of motion and neck supple.  ?Skin: ?   General: Skin is warm and dry.  ?Neurological:  ?   Mental Status: She is alert and oriented to person, place, and time.  ?Psychiatric:     ?   Attention and Perception: Attention normal.     ?   Mood and Affect: Mood normal.     ?   Speech: Speech normal.     ?   Behavior: Behavior is cooperative.  ? ? ? ?Assessment and Plan:  ?[name] is a 31 y.o. female presenting  to the [facility] Department for STI screening ? ?1. Screening examination for venereal disease ?-31 year old female in clinic for STD screening.  ?-Patient accepted all screenings including oral, vaginal CT/GC and bloodwork for HIV/RPR.  ?Patient meets criteria for HepB screening? No. Ordered? No - low risk  ?Patient meets criteria for HepC screening? No. Ordered? No - low risk  ? ?Treat wet prep per standing order ?Discussed time line for [facility] Lab results and that patient will be called with positive results and encouraged patient to call if she had not heard in 2 weeks.  ?Counseled to return or seek care for continued or worsening  symptoms ?Recommended condom use with all sex ? ?Patient is currently using  sterilization  to prevent pregnancy.   ? ?- HIV [facility] ?- Syphilis Serology, [facility] ?- Chlamydia/Gonorrhea [facility] ?- Gonococcus culture ?- WET PREP FOR [place], YEAST, CLUE ? ? ? ? ?Return if symptoms worsen or fail to improve. ? ? ? ?Gregary Cromer, FNP ?

## 2021-05-25 NOTE — Progress Notes (Signed)
Pt here for STD screening.  Wet mount results reviewed, no treatment required per SO.  Pt declined condoms.  Angelos Wasco M Clothilde Tippetts, RN ° °

## 2021-05-30 LAB — GONOCOCCUS CULTURE

## 2021-07-10 ENCOUNTER — Ambulatory Visit
Admission: EM | Admit: 2021-07-10 | Discharge: 2021-07-10 | Disposition: A | Discharge status: Home / Self Care | Payer: Medicaid Other | Attending: Physician Assistant | Admitting: Physician Assistant

## 2021-07-10 ENCOUNTER — Encounter: Payer: Self-pay | Admitting: Emergency Medicine

## 2021-07-10 DIAGNOSIS — R21 Rash and other nonspecific skin eruption: Secondary | ICD-10-CM | POA: Diagnosis not present

## 2021-07-10 MED ORDER — CLOTRIMAZOLE-BETAMETHASONE 1-0.05 % EX CREA: TOPICAL_CREAM | CUTANEOUS | 0 refills | Status: AC

## 2021-07-10 NOTE — Discharge Instructions (Addendum)
-  Your rash is faintly red but there is concern about potential fungal infection or dermatitis.  I sent a cream that contains a fungal medication and a corticosteroid.  Apply twice a day. - Keep the area dry and clean. - Follow-up if symptoms are worsening or not improving in the next week.

## 2021-07-10 NOTE — ED Provider Notes (Signed)
[facility]    CSN: [id number] Arrival date & time: 07/10/21  1832      History   Chief Complaint Chief Complaint  Patient presents with   Rash    HPI Wendy Bishop is a 31 y.o. female presenting for erythematous and burning rash of the lower abdomen under her abdominal fold.  She says she noticed it today after getting out of the shower.  She says it feels a little warm.  She denies any pruritus or significant pain.  She is concerned about possible skin yeast infection.  She has not treated condition in any way.  She has no history of skin problems.  She does not report any fevers or abdominal pain, nausea or vomiting.  No other complaints.  HPI  Past Medical History:  Diagnosis Date   Abnormal Pap smear of cervix    Complication of anesthesia    pt had nausea with her 2nd C-section   High-risk pregnancy    Premature delivery     Patient Active Problem List   Diagnosis Date Noted   History of bilateral tubal ligation 2018 06/03/2020   Obesity BMI=33.5 06/03/2020   Hirsutism 06/03/2020   Tobacco use 1 ppd 04/07/2016    Past Surgical History:  Procedure Laterality Date   CESAREAN SECTION     CESAREAN SECTION WITH BILATERAL TUBAL LIGATION N/A 05/11/2016   Procedure: CESAREAN SECTION WITH BILATERAL TUBAL LIGATION;  Surgeon: Conard Novak, MD;  Location: [facility] ORS;  Service: Obstetrics;  Laterality: N/A;  Baby Boy born @ 60 Apgars: 8/9 Weight: 7lb 8oz   CHOLECYSTECTOMY     FRACTURE SURGERY Left 2013   5th toes fracture wire placed    OB History     Gravida  4   Para  4   Term  3   Preterm  1   AB      Living  3      SAB      IAB      Ectopic      Multiple  0   Live Births  4            Home Medications    Prior to Admission medications   Medication Sig Start Date End Date Taking? Authorizing Provider  clotrimazole-betamethasone (LOTRISONE) cream Apply to affected area 2 times daily prn 07/10/21 07/17/21 Yes Eusebio Friendly B, PA-C  ondansetron (ZOFRAN-ODT) 4 MG disintegrating tablet Take 1 tablet (4 mg total) by mouth every 8 (eight) hours as needed for nausea or vomiting. Patient not taking: Reported on 05/25/2021 07/29/20   Kem Boroughs B, FNP  sulfamethoxazole-trimethoprim (BACTRIM DS) 800-160 MG tablet Take 1 tablet by mouth 2 (two) times daily. Patient not taking: Reported on 05/25/2021 07/29/20   Kem Boroughs B, FNP  traMADol (ULTRAM) 50 MG tablet Take 1 tablet (50 mg total) by mouth every 6 (six) hours as needed. Patient not taking: Reported on 05/25/2021 07/29/20   Chinita Pester, FNP    Family History Family History  Problem Relation Age of Onset   Healthy Mother    Healthy Father     Social History Social History   Tobacco Use   Smoking status: Every Day    Packs/day: 1.00    Years: 10.00    Total pack years: 10.00    Types: Cigarettes   Smokeless tobacco: Never  Vaping Use   Vaping Use: Never used  Substance Use Topics   Alcohol use: No   Drug  use: No     Allergies   Patient has no known allergies.   Review of Systems Review of Systems  Constitutional:  Negative for fatigue and fever.  Gastrointestinal:  Negative for abdominal pain, nausea and vomiting.  Skin:  Positive for color change and rash. Negative for wound.  Neurological:  Negative for weakness.     Physical Exam Triage Vital Signs ED Triage Vitals  Enc Vitals Group     BP 07/10/21 1918 121/76     Pulse Rate 07/10/21 1918 61     Resp 07/10/21 1918 14     Temp 07/10/21 1918 98.3 F (36.8 C)     Temp Source 07/10/21 1918 Oral     SpO2 07/10/21 1918 100 %     Weight 07/10/21 1916 192 lb (87.1 kg)     Height 07/10/21 1916 5\' 4"  (1.626 m)     Head Circumference --      Peak Flow --      Pain Score 07/10/21 1916 0     Pain Loc --      Pain Edu? --      Excl. in GC? --    No data found.  Updated Vital Signs BP 121/76 (BP Location: Right Arm)   Pulse 61   Temp 98.3 F (36.8 C) (Oral)   Resp 14    Ht 5\' 4"  (1.626 m)   Wt 192 lb (87.1 kg)   LMP 06/26/2021 (Approximate)   SpO2 100%   BMI 32.96 kg/m      Physical Exam Vitals and nursing note reviewed.  Constitutional:      General: She is not in acute distress.    Appearance: Normal appearance. She is not ill-appearing or toxic-appearing.  HENT:     Head: Normocephalic and atraumatic.  Eyes:     General: No scleral icterus.       Right eye: No discharge.        Left eye: No discharge.     Conjunctiva/sclera: Conjunctivae normal.  Cardiovascular:     Rate and Rhythm: Normal rate and regular rhythm.     Heart sounds: Normal heart sounds.  Pulmonary:     Effort: Pulmonary effort is normal. No respiratory distress.     Breath sounds: Normal breath sounds.  Abdominal:     Palpations: Abdomen is soft.     Tenderness: There is no abdominal tenderness.  Musculoskeletal:     Cervical back: Neck supple.  Skin:    General: Skin is dry.     Findings: Rash (moist erythematous maculopapular rash under lower abdominal fold) present.  Neurological:     General: No focal deficit present.     Mental Status: She is alert. Mental status is at baseline.     Motor: No weakness.     Gait: Gait normal.  Psychiatric:        Mood and Affect: Mood normal.        Behavior: Behavior normal.        Thought Content: Thought content normal.      UC Treatments / Results  Labs (all labs ordered are listed, but only abnormal results are displayed) Labs Reviewed - No data to display  EKG   Radiology No results found.  Procedures Procedures (including critical care time)  Medications Ordered in UC Medications - No data to display  Initial Impression / Assessment and Plan / UC Course  I have reviewed the triage vital signs and the nursing notes.  Pertinent  labs & imaging results that were available during my care of the patient were reviewed by me and considered in my medical decision making (see chart for details).  31 year old  female presenting for burning type rash under her lower abdominal fold that she noticed today.  Denies pruritus or significant pain.  Rash appears to be consistent with early intertrigo versus dermatitis.  We will treat at this time with Lotrisone.  Advised supportive care.  Reviewed return precautions.   Final Clinical Impressions(s) / UC Diagnoses   Final diagnoses:  Rash and nonspecific skin eruption     Discharge Instructions      -Your rash is faintly red but there is concern about potential fungal infection or dermatitis.  I sent a cream that contains a fungal medication and a corticosteroid.  Apply twice a day. - Keep the area dry and clean. - Follow-up if symptoms are worsening or not improving in the next week.     ED Prescriptions     Medication Sig Dispense Auth. Provider   clotrimazole-betamethasone (LOTRISONE) cream Apply to affected area 2 times daily prn 30 g Shirlee Latch, PA-C      PDMP not reviewed this encounter.   Shirlee Latch, PA-C 07/10/21 2005

## 2021-07-10 NOTE — ED Triage Notes (Signed)
Patient c/o burning rash in her mid lower abdomen that started this morning.  Patient states that the rash has some warmth to it.

## 2021-08-09 IMAGING — CT CT RENAL STONE PROTOCOL
3 of 4 series · 8 of 46 positions shown, 15 images · non-contrast
Comparison: CT 05/23/2020

CLINICAL DATA: Body aches, subjective fevers, chills, nausea and
vomiting. Symptoms when patient woke. History of cholecystectomy and
prior Caesarean

EXAM:
CT ABDOMEN AND PELVIS WITHOUT CONTRAST
TECHNIQUE: Multidetector CT imaging of the abdomen and pelvis was performed
following the standard protocol without IV contrast.

[Series 4: lung bases · axial · 0.79mm/px · z∈[-160,-100]mm · 4 of 21 slices shown, 9 images]
[im 5/21  soft-tissue]
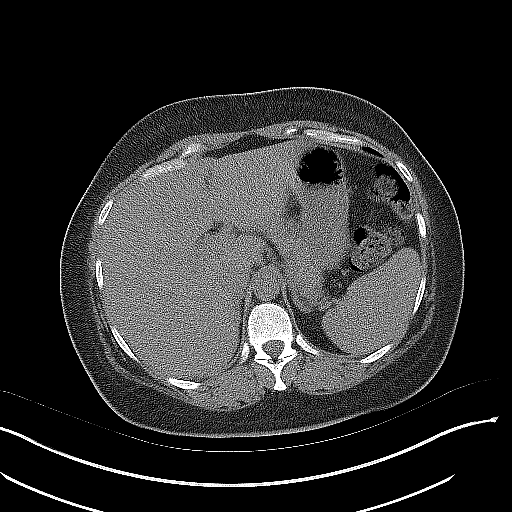
[im 5/21  lung]
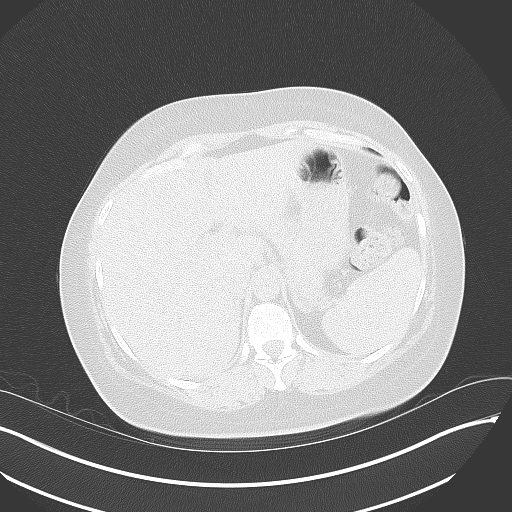
[im 5/21  bone]
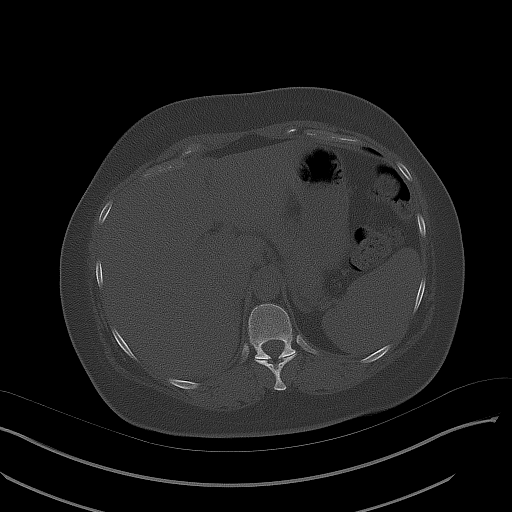
[im 9/21  soft-tissue]
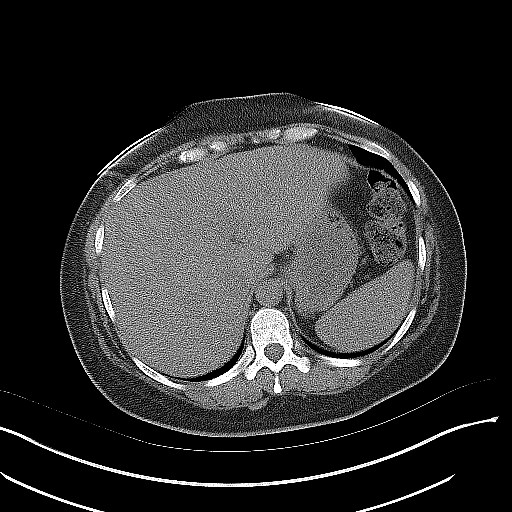
[im 9/21  lung]
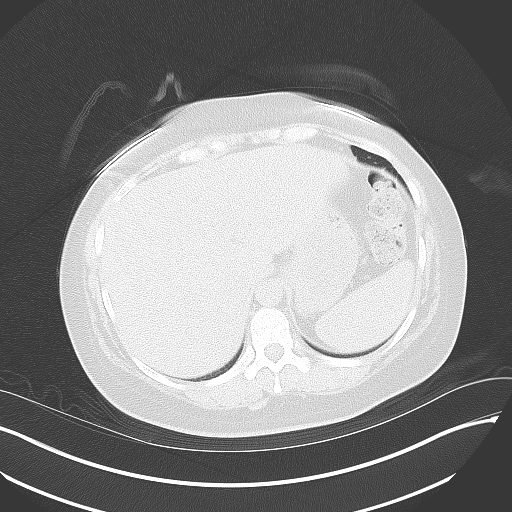
[im 13/21  soft-tissue]
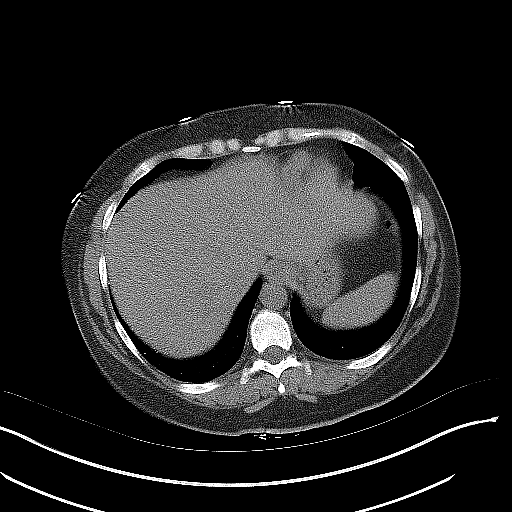
[im 13/21  lung]
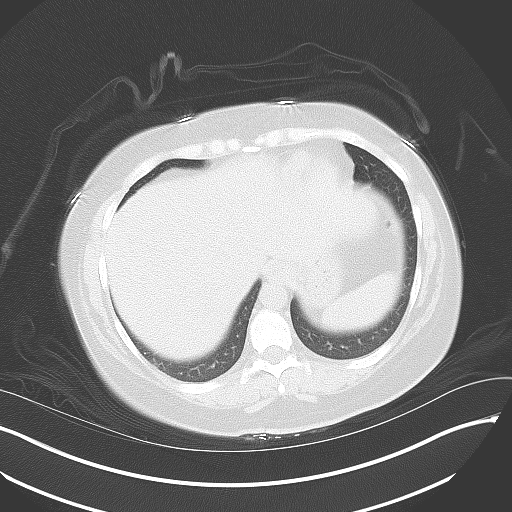
[im 17/21  soft-tissue]
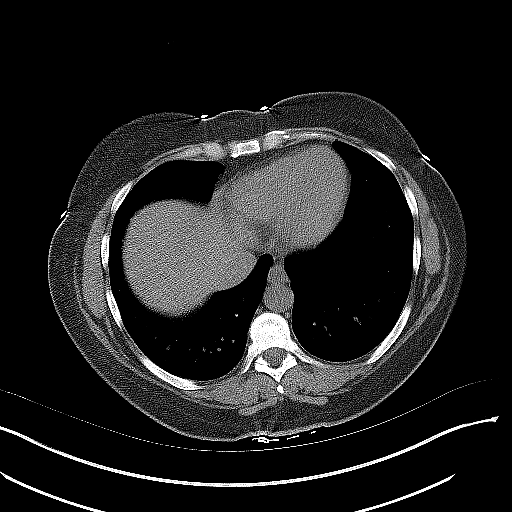
[im 17/21  lung]
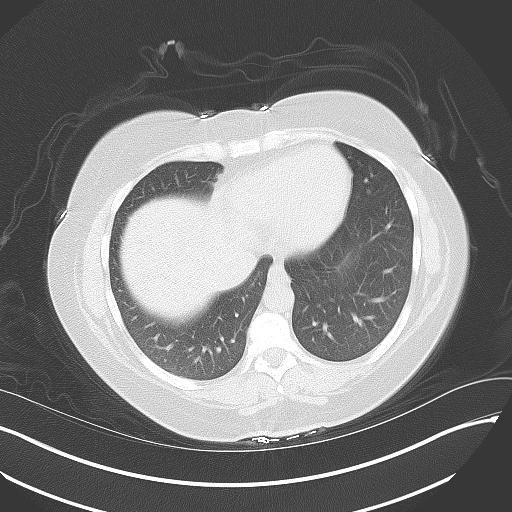

[Series 5: coronal · coronal · 0.85mm/px · 3 of 146 slices shown, 4 images]
[im 49/146  soft-tissue]
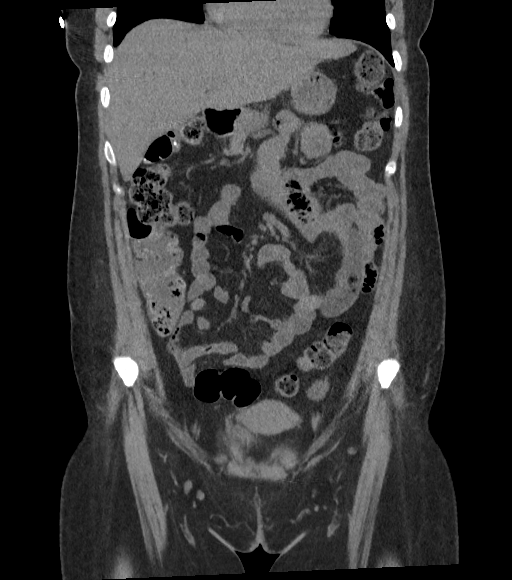
[im 65/146  soft-tissue]
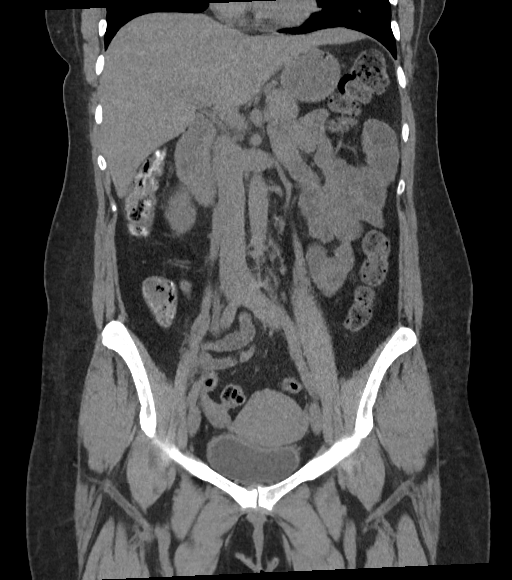
[im 65/146  bone]
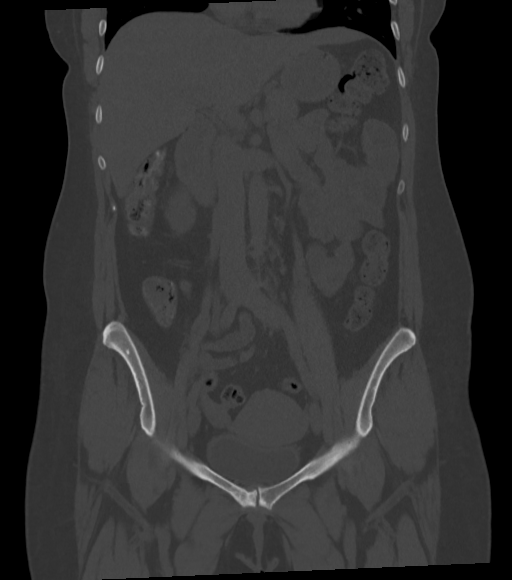
[im 81/146  soft-tissue]
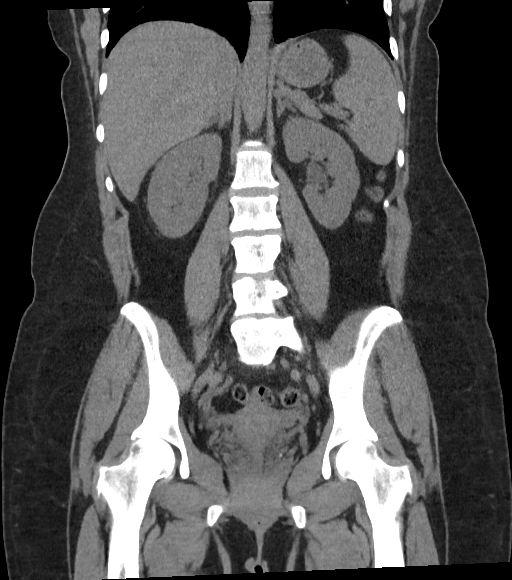

[Series 6: sagittal · sagittal · 0.63mm/px · 1 of 214 slices shown, 2 images]
[im 72/214  soft-tissue]
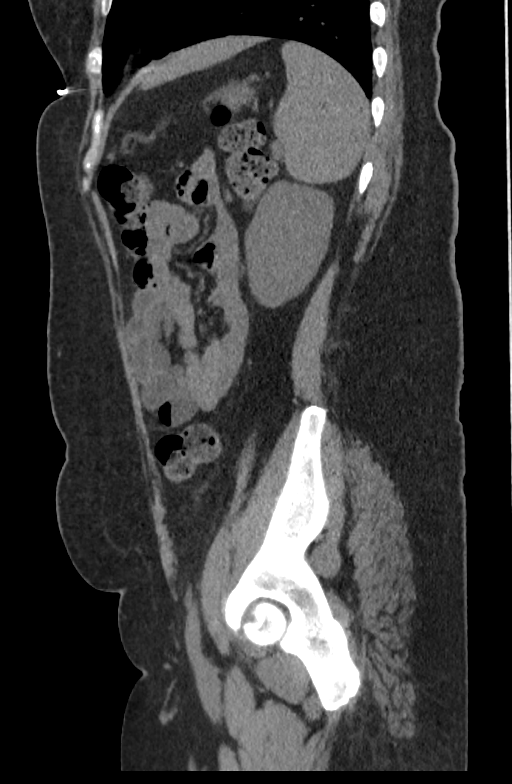
[im 72/214  bone]
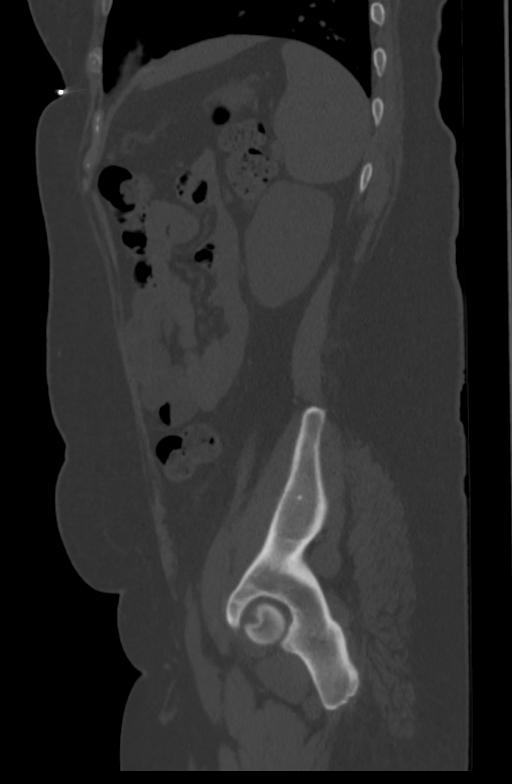

[8 of 46 positions shown; findings below may reference images not displayed]

FINDINGS: Lower chest: Lung bases are clear. Normal heart size. No pericardial
effusion.

Hepatobiliary: No visible focal liver lesion with limitations of an
unenhanced CT. Normal hepatic attenuation. Smooth liver surface
contour. Prior cholecystectomy. No visible intraductal gallstones or
significant biliary ductal dilatation.

Pancreas: No pancreatic ductal dilatation or surrounding
inflammatory changes.

Spleen: Normal in size. No concerning splenic lesions.

Adrenals/Urinary Tract: Normal adrenal glands. No visible or contour
deforming renal lesions. Asymmetric right perinephric and
periureteral stranding to the level of a circumferentially thickened
urinary bladder. No visible urolithiasis or frank hydronephrosis. No
left urinary tract dilatation or significant perinephric or
periureteral stranding. No visible bladder calculi or debris. No
visible calcifications along the expected course of the urethra.

Stomach/Bowel: Distal esophagus, stomach and duodenal sweep are
unremarkable. No small bowel wall thickening or dilatation. No
colonic dilatation or wall thickening. Noninflamed appendix in the
right lower quadrant and retrocecal position. No evidence of bowel
obstruction.

Vascular/Lymphatic: No significant vascular findings are present. No
enlarged abdominal or pelvic lymph nodes.

Reproductive: Anteverted uterus. No concerning adnexal masses or
lesions.

Other: Mild ventral diastasis. No bowel containing hernia.
Postsurgical changes the anterior abdominal wall compatible with
prior Caesarean. No body wall or retroperitoneal hematoma. No free
fluid or air.

Musculoskeletal: No acute osseous abnormality or suspicious osseous
lesion. Transitional lumbosacral vertebrae denoted as L6 as well as
and unfused left L1 transverse process versus rudimentary rib.
IMPRESSION: 1. Asymmetric right perinephric and periureteral stranding with mild
bladder wall thickening. Findings could reflect a cystitis and
ascending tract infection with pyelonephritis. Recently passed
calculus is possible though less favored. Correlate with urinalysis.
2. Prior cholecystectomy.
3. Tiny fat containing umbilical hernia and mild rectus diastasis.
4. Transitional thoracolumbosacral anatomy as above.

## 2021-09-26 ENCOUNTER — Other Ambulatory Visit: Payer: Self-pay

## 2021-09-26 DIAGNOSIS — Z5321 Procedure and treatment not carried out due to patient leaving prior to being seen by health care provider: Secondary | ICD-10-CM | POA: Diagnosis not present

## 2021-09-26 DIAGNOSIS — J029 Acute pharyngitis, unspecified: Secondary | ICD-10-CM | POA: Diagnosis present

## 2021-09-26 NOTE — ED Triage Notes (Signed)
Mother reports pt's brother tested positive for strep throat and pt has been c/o sore throat x 1 week. Mother denies fever at home today. Pt is AOX4, in NAD. Airway is patent, no tonsil swelling noted. Pt denies pain at this time, pt denies body aches/chills today.

## 2021-09-26 NOTE — ED Notes (Signed)
Strep swab sent to the lab at this time.  

## 2021-09-27 ENCOUNTER — Emergency Department
Admission: EM | Admit: 2021-09-27 | Discharge: 2021-09-27 | Discharge status: Against Medical Advice | Payer: Medicaid Other | Attending: Emergency Medicine | Admitting: Emergency Medicine

## 2021-09-27 LAB — GROUP A STREP BY PCR: Group A Strep by PCR: NOT DETECTED

## 2022-02-02 ENCOUNTER — Ambulatory Visit
Admission: EM | Admit: 2022-02-02 | Discharge: 2022-02-02 | Disposition: A | Discharge status: Home / Self Care | Payer: Medicaid Other

## 2022-02-02 ENCOUNTER — Encounter: Payer: Self-pay | Admitting: Emergency Medicine

## 2022-02-02 DIAGNOSIS — H9201 Otalgia, right ear: Secondary | ICD-10-CM

## 2022-02-02 NOTE — ED Provider Notes (Signed)
[facility]    CSN: [id number] Arrival date & time: 02/02/22  1120      History   Chief Complaint Chief Complaint  Patient presents with   Otalgia    HPI Wendy Bishop is a 32 y.o. female presenting for 1 week history of right-sided ear pain.  She reports that she had cough and congestion last week.  Exposed to RSV.  She reports cough and congestion has resolved.  She has not any fever or drainage from ear.  Normal hearing.  Reports sometimes her ear feels full.  She says she does not have any pain right now but it hurt really bad yesterday.  She wants to see if she has an ear infection.  HPI  Past Medical History:  Diagnosis Date   Abnormal Pap smear of cervix    Complication of anesthesia    pt had nausea with her 2nd C-section   High-risk pregnancy    Premature delivery     Patient Active Problem List   Diagnosis Date Noted   History of bilateral tubal ligation 2018 06/03/2020   Obesity BMI=33.5 06/03/2020   Hirsutism 06/03/2020   Tobacco use 1 ppd 04/07/2016    Past Surgical History:  Procedure Laterality Date   CESAREAN SECTION     CESAREAN SECTION WITH BILATERAL TUBAL LIGATION N/A 05/11/2016   Procedure: CESAREAN SECTION WITH BILATERAL TUBAL LIGATION;  Surgeon: Will Bonnet, MD;  Location: [facility];  Service: Obstetrics;  Laterality: N/A;  Baby Boy born @ 26 Apgars: 8/9 Weight: 7lb 8oz   CHOLECYSTECTOMY     FRACTURE SURGERY Left 2013   5th toes fracture wire placed    OB History     Gravida  4   Para  4   Term  3   Preterm  1   AB      Living  3      SAB      IAB      Ectopic      Multiple  0   Live Births  4            Home Medications    Prior to Admission medications   Medication Sig Start Date End Date Taking? Authorizing Provider  ondansetron (ZOFRAN-ODT) 4 MG disintegrating tablet Take 1 tablet (4 mg total) by mouth every 8 (eight) hours as needed for nausea or vomiting. Patient not taking:  Reported on 05/25/2021 07/29/20   Sherrie George B, FNP  sulfamethoxazole-trimethoprim (BACTRIM DS) 800-160 MG tablet Take 1 tablet by mouth 2 (two) times daily. Patient not taking: Reported on 05/25/2021 07/29/20   Sherrie George B, FNP  traMADol (ULTRAM) 50 MG tablet Take 1 tablet (50 mg total) by mouth every 6 (six) hours as needed. Patient not taking: Reported on 05/25/2021 07/29/20   Victorino Dike, FNP    Family History Family History  Problem Relation Age of Onset   Healthy Mother    Healthy Father     Social History Social History   Tobacco Use   Smoking status: Every Day    Packs/day: 1.00    Years: 10.00    Total pack years: 10.00    Types: Cigarettes   Smokeless tobacco: Never  Vaping Use   Vaping Use: Never used  Substance Use Topics   Alcohol use: No   Drug use: No     Allergies   Patient has no known allergies.   Review of Systems Review of Systems  Constitutional:  Negative for chills, diaphoresis, fatigue and fever.  HENT:  Positive for congestion and ear pain. Negative for ear discharge, hearing loss, rhinorrhea, sinus pressure, sinus pain and sore throat.   Respiratory:  Negative for cough and shortness of breath.   Gastrointestinal:  Negative for abdominal pain, nausea and vomiting.  Musculoskeletal:  Negative for arthralgias and myalgias.  Skin:  Negative for rash.  Neurological:  Negative for weakness and headaches.  Hematological:  Negative for adenopathy.     Physical Exam Triage Vital Signs ED Triage Vitals  Enc Vitals Group     BP 02/02/22 1305 116/65     Pulse Rate 02/02/22 1305 61     Resp 02/02/22 1305 16     Temp 02/02/22 1305 98.2 F (36.8 C)     Temp Source 02/02/22 1305 Oral     SpO2 02/02/22 1305 100 %     Weight --      Height --      Head Circumference --      Peak Flow --      Pain Score 02/02/22 1304 0     Pain Loc --      Pain Edu? --      Excl. in [place]? --    No data found.  Updated Vital Signs BP 116/65 (BP  Location: Right Arm)   Pulse 61   Temp 98.2 F (36.8 C) (Oral)   Resp 16   LMP 01/20/2022   SpO2 100%       Physical Exam Vitals and nursing note reviewed.  Constitutional:      General: She is not in acute distress.    Appearance: Normal appearance. She is not ill-appearing or toxic-appearing.  HENT:     Head: Normocephalic and atraumatic.     Right Ear: Ear canal and external ear normal. A middle ear effusion is present.     Left Ear: Tympanic membrane, ear canal and external ear normal.     Nose: Congestion present.     Mouth/Throat:     Mouth: Mucous membranes are moist.     Pharynx: Oropharynx is clear.  Eyes:     General: No scleral icterus.       Right eye: No discharge.        Left eye: No discharge.     Conjunctiva/sclera: Conjunctivae normal.  Cardiovascular:     Rate and Rhythm: Normal rate and regular rhythm.     Heart sounds: Normal heart sounds.  Pulmonary:     Effort: Pulmonary effort is normal. No respiratory distress.     Breath sounds: Normal breath sounds.  Musculoskeletal:     Cervical back: Neck supple.  Skin:    General: Skin is dry.  Neurological:     General: No focal deficit present.     Mental Status: She is alert. Mental status is at baseline.     Motor: No weakness.     Gait: Gait normal.  Psychiatric:        Mood and Affect: Mood normal.        Behavior: Behavior normal.        Thought Content: Thought content normal.      UC Treatments / Results  Labs (all labs ordered are listed, but only abnormal results are displayed) Labs Reviewed - No data to display  EKG   Radiology No results found.  Procedures Procedures (including critical care time)  Medications Ordered in UC Medications - No data to display  Initial Impression /  Assessment and Plan / UC Course  I have reviewed the triage vital signs and the nursing notes.  Pertinent labs & imaging results that were available during my care of the patient were reviewed by  me and considered in my medical decision making (see chart for details).   32 year old female presents for right-sided ear pain for the past week.  Has also had cold-like symptoms which have gotten better.  No drainage from the ear and no pain at this time.  Pain comes and goes and was worse yesterday.  On exam she has mild clear effusion of the right TM without bulging or erythema and continued mild nasal congestion.  Exam otherwise normal.  Advised her she does not have an ear infection.  Discomfort likely due to the effusion.  Advised decongestant and nasal spray.  Follow-up as needed especially if she has a fever or worsening ear pain.  She declines an AVS.  Declines note for work.   Final Clinical Impressions(s) / UC Diagnoses   Final diagnoses:  Otalgia of right ear   Discharge Instructions   None    ED Prescriptions   None    PDMP not reviewed this encounter.   Danton Clap, PA-C 02/02/22 1344

## 2022-02-02 NOTE — ED Triage Notes (Signed)
Pt presents with right ear pain x 1 week  

## 2023-05-26 NOTE — Progress Notes (Unsigned)
 [place]   No chief complaint on file.   HPI:      Ms. Wendy Bishop is a 33 y.o. [phone] whose LMP was No LMP recorded., presents today for NP eval AUB  S/p TL No recent pap    Patient Active Problem List   Diagnosis Date Noted   History of bilateral tubal ligation 2018 06/03/2020   Obesity BMI=33.5 06/03/2020   Hirsutism 06/03/2020   Tobacco use 1 ppd 04/07/2016    Past Surgical History:  Procedure Laterality Date   CESAREAN SECTION     CESAREAN SECTION WITH BILATERAL TUBAL LIGATION N/A 05/11/2016   Procedure: CESAREAN SECTION WITH BILATERAL TUBAL LIGATION;  Surgeon: Kris Pester, MD;  Location: [facility];  Service: Obstetrics;  Laterality: N/A;  Baby Boy born @ 17 Apgars: 8/9 Weight: 7lb 8oz   CHOLECYSTECTOMY     FRACTURE SURGERY Left 2013   5th toes fracture wire placed    Family History  Problem Relation Age of Onset   Healthy Mother    Healthy Father     Social History   Socioeconomic History   Marital status: Single    Spouse name: Not on file   Number of children: Not on file   Years of education: Not on file   Highest education level: Not on file  Occupational History   Not on file  Tobacco Use   Smoking status: Every Day    Current packs/day: 1.00    Average packs/day: 1 pack/day for 10.0 years (10.0 ttl pk-yrs)    Types: Cigarettes   Smokeless tobacco: Never  Vaping Use   Vaping status: Never Used  Substance and Sexual Activity   Alcohol use: No   Drug use: No   Sexual activity: Yes    Partners: Male    Birth control/protection: Surgical  Other Topics Concern   Not on file  Social History Narrative   Not on file   Social Drivers of Health   Financial Resource Strain: Not on file  Food Insecurity: Not on file  Transportation Needs: Not on file  Physical Activity: Not on file  Stress: Not on file  Social Connections: Not on file  Intimate Partner Violence: Not At Risk (05/25/2021)   Humiliation,  Afraid, Rape, and Kick questionnaire    Fear of Current or Ex-Partner: No    Emotionally Abused: No    Physically Abused: No    Sexually Abused: No    Outpatient Medications Prior to Visit  Medication Sig Dispense Refill   ondansetron  (ZOFRAN -ODT) 4 MG disintegrating tablet Take 1 tablet (4 mg total) by mouth every 8 (eight) hours as needed for nausea or vomiting. (Patient not taking: Reported on 05/25/2021) 20 tablet 0   sulfamethoxazole -trimethoprim  (BACTRIM  DS) 800-160 MG tablet Take 1 tablet by mouth 2 (two) times daily. (Patient not taking: Reported on 05/25/2021) 20 tablet 0   traMADol  (ULTRAM ) 50 MG tablet Take 1 tablet (50 mg total) by mouth every 6 (six) hours as needed. (Patient not taking: Reported on 05/25/2021) 12 tablet 0   No facility-administered medications prior to visit.      ROS:  Review of Systems BREAST: No symptoms   OBJECTIVE:   Vitals:  There were no vitals taken for this visit.  Physical Exam  Results: No results found for this or any previous visit (from the past 24 hours).   Assessment/Plan: No diagnosis found.    No orders of the defined types were placed in  this encounter.     No follow-ups on file.  Jordayn Mink B. Dove Gresham, PA-C 05/26/2023 5:21 PM

## 2023-05-27 ENCOUNTER — Encounter: Payer: Self-pay | Admitting: Obstetrics and Gynecology

## 2023-05-27 ENCOUNTER — Other Ambulatory Visit (HOSPITAL_COMMUNITY)
Admission: RE | Admit: 2023-05-27 | Discharge: 2023-05-27 | Disposition: A | Discharge status: Home / Self Care | Source: Ambulatory Visit | Attending: Obstetrics and Gynecology | Admitting: Obstetrics and Gynecology

## 2023-05-27 ENCOUNTER — Ambulatory Visit (INDEPENDENT_AMBULATORY_CARE_PROVIDER_SITE_OTHER): Admitting: Obstetrics and Gynecology

## 2023-05-27 VITALS — BP 110/70 | Ht 64.0 in | Wt 194.0 lb

## 2023-05-27 DIAGNOSIS — Z1151 Encounter for screening for human papillomavirus (HPV): Secondary | ICD-10-CM

## 2023-05-27 DIAGNOSIS — N939 Abnormal uterine and vaginal bleeding, unspecified: Secondary | ICD-10-CM

## 2023-05-27 DIAGNOSIS — Z3202 Encounter for pregnancy test, result negative: Secondary | ICD-10-CM | POA: Diagnosis not present

## 2023-05-27 DIAGNOSIS — Z113 Encounter for screening for infections with a predominantly sexual mode of transmission: Secondary | ICD-10-CM | POA: Insufficient documentation

## 2023-05-27 DIAGNOSIS — Z124 Encounter for screening for malignant neoplasm of cervix: Secondary | ICD-10-CM | POA: Diagnosis present

## 2023-05-27 LAB — POCT URINE PREGNANCY: Preg Test, Ur: NEGATIVE

## 2023-05-27 NOTE — Patient Instructions (Signed)
 I value your feedback and you entrusting [place] with your care. If you get a [place] patient survey, I would appreciate you taking the time to let us know about your experience today. Thank you! ? ? ?

## 2023-05-30 ENCOUNTER — Encounter: Payer: Self-pay | Admitting: Obstetrics and Gynecology

## 2023-05-30 LAB — CYTOLOGY - PAP
Adequacy: ABSENT
Chlamydia: NEGATIVE
Comment: NEGATIVE
Comment: NEGATIVE
Comment: NORMAL
Diagnosis: NEGATIVE
High risk HPV: NEGATIVE
Neisseria Gonorrhea: NEGATIVE

## 2023-07-12 ENCOUNTER — Encounter: Payer: Self-pay | Admitting: Family Medicine

## 2023-07-12 ENCOUNTER — Ambulatory Visit (INDEPENDENT_AMBULATORY_CARE_PROVIDER_SITE_OTHER): Admitting: Family Medicine

## 2023-07-12 VITALS — BP 128/74 | HR 78 | Ht 64.0 in | Wt 184.0 lb

## 2023-07-12 DIAGNOSIS — Z13220 Encounter for screening for lipoid disorders: Secondary | ICD-10-CM | POA: Diagnosis not present

## 2023-07-12 DIAGNOSIS — E66811 Obesity, class 1: Secondary | ICD-10-CM

## 2023-07-12 DIAGNOSIS — N939 Abnormal uterine and vaginal bleeding, unspecified: Secondary | ICD-10-CM

## 2023-07-12 DIAGNOSIS — G44221 Chronic tension-type headache, intractable: Secondary | ICD-10-CM

## 2023-07-12 DIAGNOSIS — Z136 Encounter for screening for cardiovascular disorders: Secondary | ICD-10-CM

## 2023-07-12 DIAGNOSIS — Z131 Encounter for screening for diabetes mellitus: Secondary | ICD-10-CM

## 2023-07-12 DIAGNOSIS — Z720 Tobacco use: Secondary | ICD-10-CM

## 2023-07-12 DIAGNOSIS — F17200 Nicotine dependence, unspecified, uncomplicated: Secondary | ICD-10-CM

## 2023-07-12 NOTE — Progress Notes (Signed)
 New Patient Office Visit  Subjective    Patient ID: Wendy Bishop, female    DOB: 07/10/1990  Age: 33 y.o. MRN: [id number]  CC:  Chief Complaint  Patient presents with   New Patient (Initial Visit)   Contraception    Wants to start birth control pill to help control irregular menstrual periods.    Headache    Patient has had headaches on and off for years. Patient said her headaches gets worse around her menstrual cycle. She becomes sensitive to sound and light and can have nausea with them. Patient last headache was yesterday that lasted for 8 hours. Patient has tried ibuprofen .     Assessment & Plan:   Chronic tension-type headache, intractable  Abnormal uterine bleeding (AUB) -     FSH/LH  Nicotine dependence with current use -     Ambulatory referral to Virtual Care Smoking Cessation  Tobacco use 1 ppd -     Ambulatory referral to Virtual Care Smoking Cessation  Encounter for lipid screening for cardiovascular disease -     Lipid panel  Diabetes mellitus screening -     Hemoglobin A1c  Obesity (BMI 30.0-34.9) -     CBC with Differential/Platelet -     Comprehensive metabolic panel with GFR -     Thyroid Panel With TSH   Routine labs ordered.   Pt will follow up with Gyn for AUB.  Encouraged quit smoking. Virtual care referral placed.  Pt will try Aleve/ Excedrin prn.    Return in about 4 weeks (around 08/09/2023), or if symptoms worsen or fail to improve.   Vinary K Wendy Creer, MD       Hx of tubal ligation, 2018.   Has been having irregular periods since April. X 3 months Saw Gyn in April.   Was told to monitor, next visit is in July.  Usually periods last 7 days, varies 8 - 10 days.   LMP:  Week and half ago.  Denies history of blood clot. Smokes cigarette pack a day.  Interested in quitting. Tried pills, patch,   Migraines:  Bilateral .  Family hx positive.  Onset :  age of 55- 81. Does happen around her periods some times. Triggers:  Unknown.  Frequency every other week, 3 -4 times per week.  Last : 4 -5 hrs.  Tries Ibuprofen  6 pills Excedrin sometimes they work.   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     Wendy Bishop presents to establish care   No outpatient encounter medications on file as of 07/12/2023.   No facility-administered encounter medications on file as of 07/12/2023.      Review of Systems  All other systems reviewed and are negative.       Objective    BP 128/74   Pulse 78   Ht 5\' 4"  (1.626 m)   Wt 184 lb (83.5 kg)   SpO2 100%   BMI 31.58 kg/m   Physical Exam Vitals and nursing note reviewed.  Constitutional:      Appearance: Normal appearance.  HENT:     Head: Normocephalic.     Right Ear: External ear normal.     Left Ear: External ear normal.  Eyes:     Conjunctiva/sclera: Conjunctivae normal.  Cardiovascular:     Rate and Rhythm: Normal rate.  Pulmonary:     Effort: Pulmonary effort is normal. No respiratory distress.  Abdominal:     Palpations: Abdomen is soft.  Musculoskeletal:  General: Normal range of motion.  Skin:    General: Skin is warm.  Neurological:     Mental Status: She is alert and oriented to person, place, and time.  Psychiatric:        Mood and Affect: Mood normal.

## 2023-07-13 ENCOUNTER — Ambulatory Visit: Payer: Self-pay | Admitting: Family Medicine

## 2023-07-13 LAB — CBC WITH DIFFERENTIAL/PLATELET
Basophils Absolute: 0 10*3/uL (ref 0.0–0.2)
Basos: 0 %
EOS (ABSOLUTE): 0.2 10*3/uL (ref 0.0–0.4)
Eos: 2 %
Hematocrit: 39.2 % (ref 34.0–46.6)
Hemoglobin: 12.7 g/dL (ref 11.1–15.9)
Immature Grans (Abs): 0 10*3/uL (ref 0.0–0.1)
Immature Granulocytes: 0 %
Lymphocytes Absolute: 2.6 10*3/uL (ref 0.7–3.1)
Lymphs: 37 %
MCH: 32.1 pg (ref 26.6–33.0)
MCHC: 32.4 g/dL (ref 31.5–35.7)
MCV: 99 fL — ABNORMAL HIGH (ref 79–97)
Monocytes Absolute: 0.6 10*3/uL (ref 0.1–0.9)
Monocytes: 8 %
Neutrophils Absolute: 3.7 10*3/uL (ref 1.4–7.0)
Neutrophils: 53 %
Platelets: 438 10*3/uL (ref 150–450)
RBC: 3.96 x10E6/uL (ref 3.77–5.28)
RDW: 12.6 % (ref 11.7–15.4)
WBC: 7.1 10*3/uL (ref 3.4–10.8)

## 2023-07-13 LAB — COMPREHENSIVE METABOLIC PANEL WITH GFR
ALT: 15 IU/L (ref 0–32)
AST: 10 IU/L (ref 0–40)
Albumin: 4 g/dL (ref 3.9–4.9)
Alkaline Phosphatase: 89 IU/L (ref 44–121)
BUN/Creatinine Ratio: 7 — ABNORMAL LOW (ref 9–23)
BUN: 6 mg/dL (ref 6–20)
Bilirubin Total: 0.2 mg/dL (ref 0.0–1.2)
CO2: 24 mmol/L (ref 20–29)
Calcium: 9.2 mg/dL (ref 8.7–10.2)
Chloride: 103 mmol/L (ref 96–106)
Creatinine, Ser: 0.81 mg/dL (ref 0.57–1.00)
Globulin, Total: 2.3 g/dL (ref 1.5–4.5)
Glucose: 74 mg/dL (ref 70–99)
Potassium: 4.5 mmol/L (ref 3.5–5.2)
Sodium: 142 mmol/L (ref 134–144)
Total Protein: 6.3 g/dL (ref 6.0–8.5)
eGFR: 99 mL/min/{1.73_m2} (ref >59)

## 2023-07-13 LAB — HEMOGLOBIN A1C
Est. average glucose Bld gHb Est-mCnc: 103 mg/dL
Hgb A1c MFr Bld: 5.2 % (ref 4.8–5.6)

## 2023-07-13 LAB — THYROID PANEL WITH TSH
Free Thyroxine Index: 1.4 (ref 1.2–4.9)
T3 Uptake Ratio: 23 % — ABNORMAL LOW (ref 24–39)
T4, Total: 6.2 ug/dL (ref 4.5–12.0)
TSH: 1.35 u[IU]/mL (ref 0.450–4.500)

## 2023-07-13 LAB — LIPID PANEL
Chol/HDL Ratio: 4.5 ratio — ABNORMAL HIGH (ref 0.0–4.4)
Cholesterol, Total: 134 mg/dL (ref 100–199)
HDL: 30 mg/dL — ABNORMAL LOW (ref >39)
LDL Chol Calc (NIH): 85 mg/dL (ref 0–99)
Triglycerides: 100 mg/dL (ref 0–149)
VLDL Cholesterol Cal: 19 mg/dL (ref 5–40)

## 2023-07-13 LAB — FSH/LH
FSH: 5.6 m[IU]/mL
LH: 12.9 m[IU]/mL

## 2023-08-09 ENCOUNTER — Encounter: Payer: Self-pay | Admitting: Family Medicine

## 2023-08-09 ENCOUNTER — Ambulatory Visit (INDEPENDENT_AMBULATORY_CARE_PROVIDER_SITE_OTHER): Admitting: Family Medicine

## 2023-08-09 VITALS — BP 112/72 | HR 76 | Ht 64.0 in | Wt 184.0 lb

## 2023-08-09 DIAGNOSIS — Z720 Tobacco use: Secondary | ICD-10-CM | POA: Diagnosis not present

## 2023-08-09 DIAGNOSIS — G44221 Chronic tension-type headache, intractable: Secondary | ICD-10-CM

## 2023-08-09 DIAGNOSIS — Z23 Encounter for immunization: Secondary | ICD-10-CM

## 2023-08-09 MED ORDER — TIZANIDINE HCL 2 MG PO CAPS: 2.0000 mg | ORAL_CAPSULE | Freq: Three times a day (TID) | ORAL | 0 refills | Status: DC

## 2023-08-09 NOTE — Progress Notes (Signed)
   Established Patient Office Visit  Subjective   Patient ID: Wendy Bishop, female    DOB: [birth year]  Age: 33 y.o. MRN: [id number]  Chief Complaint  Patient presents with   Headache    Patient presents today for a follow up on her chronic tension headaches. She has been taking Excedrin and IBU and they helps most of the time, but sometime he medication would not touch it. Over all her headaches are less frequent.     Assessment & Plan:   Problem List Items Addressed This Visit       Other   Tobacco use 1 ppd   Other Visit Diagnoses       Chronic tension-type headache, intractable    -  Primary     Continue Tylenol  for mild headaches. Rx sent for Zanaflex  to see if it helps with her headaches. Advised her to try it over the weekend and continue if she tolerates it. She was also given a sample of Nurtec to take it for Migraines. Vaccines given today ; Tdap and Prevnar.Pt tolerated well. Return in about 6 months (around 02/09/2024).    Here for Headache follow up.   Reports they got better. During vacations headaches improved, she feels its her work stress triggering her headaches.  Uses Excedrin or Tylenol  with partial relief.   She is not ready to quit. Refusing to try NRT, states she will do cold [place].  Pt is requesting tdap and pneumonia vaccine.       Review of Systems  All other systems reviewed and are negative.     Objective:     BP 112/72   Pulse 76   Ht 5' 4 (1.626 m)   Wt 184 lb (83.5 kg)   SpO2 99%   BMI 31.58 kg/m    Physical Exam Vitals and nursing note reviewed.  Constitutional:      Appearance: Normal appearance.  HENT:     Head: Normocephalic.     Right Ear: External ear normal.     Left Ear: External ear normal.  Eyes:     Conjunctiva/sclera: Conjunctivae normal.  Cardiovascular:     Rate and Rhythm: Normal rate.  Abdominal:     Palpations: Abdomen is soft.  Musculoskeletal:        General: Normal range of motion.   Skin:    General: Skin is warm.  Neurological:     Mental Status: She is alert and oriented to person, place, and time.  Psychiatric:        Mood and Affect: Mood normal.      No results found for any visits on 08/09/23.    The ASCVD Risk score (Arnett DK, et al., 2019) failed to calculate for the following reasons:   The 2019 ASCVD risk score is only valid for ages 65 to 26      Wendy MARLA Ny, MD

## 2023-08-10 ENCOUNTER — Telehealth: Payer: Self-pay | Admitting: Pharmacy Technician

## 2023-08-10 ENCOUNTER — Telehealth: Payer: Self-pay

## 2023-08-10 ENCOUNTER — Other Ambulatory Visit (HOSPITAL_COMMUNITY): Payer: Self-pay

## 2023-08-10 NOTE — Telephone Encounter (Signed)
 Insurance prefer tablets instead of capsules

## 2023-08-10 NOTE — Telephone Encounter (Signed)
 PA request for Tizanidine  HCl 2mg    [email or web address] KEY: [id number]

## 2023-08-10 NOTE — Telephone Encounter (Signed)
 Pharmacy Patient Advocate Encounter   Received notification from Pt Calls Messages that prior authorization for tiZANidine  HCl 2MG  capsules is required/requested.   Insurance verification completed.   The patient is insured through [facility] [place] .   Per test claim:  Tizanidine  2MG  tablets is preferred by the insurance.  If suggested medication is appropriate, Please send in a new RX and discontinue this one. If not, please advise as to why it's not appropriate so that we may request a Prior Authorization. Please note, some preferred medications may still require a PA.  If the suggested medications have not been trialed and there are no contraindications to their use, the PA will not be submitted, as it will not be approved.     INSURANCE PREFER TABLETS VERSUS CAPSULES AND HIS COPAY WILL BE $4.00

## 2023-08-10 NOTE — Telephone Encounter (Signed)
 Please send RX for tablets, insurance preference.

## 2023-08-11 ENCOUNTER — Other Ambulatory Visit: Payer: Self-pay | Admitting: Family Medicine

## 2023-08-11 DIAGNOSIS — G44221 Chronic tension-type headache, intractable: Secondary | ICD-10-CM

## 2023-08-11 MED ORDER — TIZANIDINE HCL 2 MG PO TABS: 2.0000 mg | ORAL_TABLET | Freq: Four times a day (QID) | ORAL | 0 refills | Status: DC | PRN

## 2023-08-11 NOTE — Telephone Encounter (Signed)
 New Rx sent.

## 2023-08-25 NOTE — Progress Notes (Signed)
 Kotturi, Vinay K, MD   Chief Complaint  Patient presents with   Contraception    Possibly BC pills to regulate cycle    HPI:      Ms. Wendy Bishop is a 33 y.o. [phone] whose LMP was Patient's last menstrual period was 08/15/2023 (exact date)., presents today for persistent AUB since 4/25/last appt. Menses are monthly, but lasting 14 days, heavier flow changing products hourly since blood is more watery, no clots, no BTB. Does have dysmen, improved with NSAIDs. Menses used to last 7 days but became irreg starting 4/25. Neg labs with PCP 6/25 including CBC and TSH. No GYN u/s done. Hx of migraines with aura/tob use; no hx of HTN/DVTs. Did OCPs in past, depo with wt gain and hair loss and nexplanon.  She is sexually active, s/p TL.  Neg pap 4/25  Patient Active Problem List   Diagnosis Date Noted   History of bilateral tubal ligation 2018 06/03/2020   Obesity BMI=33.5 06/03/2020   Hirsutism 06/03/2020   Tobacco use 1 ppd 04/07/2016    Past Surgical History:  Procedure Laterality Date   CESAREAN SECTION     CESAREAN SECTION WITH BILATERAL TUBAL LIGATION N/A 05/11/2016   Procedure: CESAREAN SECTION WITH BILATERAL TUBAL LIGATION;  Surgeon: Garnette JONETTA Mace, MD;  Location: [facility];  Service: Obstetrics;  Laterality: N/A;  Baby Boy born @ 107 Apgars: 8/9 Weight: 7lb 8oz   CHOLECYSTECTOMY     FRACTURE SURGERY Left 2013   5th toes fracture wire placed    Family History  Problem Relation Age of Onset   Healthy Mother    Healthy Father    Heart disease Maternal Grandmother    Heart disease Maternal Grandfather     Social History   Socioeconomic History   Marital status: Single    Spouse name: Not on file   Number of children: 3   Years of education: Not on file   Highest education level: Not on file  Occupational History   Not on file  Tobacco Use   Smoking status: Every Day    Current packs/day: 1.00    Average packs/day: 1 pack/day for 12.0 years (12.0 ttl  pk-yrs)    Types: Cigarettes   Smokeless tobacco: Never  Vaping Use   Vaping status: Never Used  Substance and Sexual Activity   Alcohol use: No   Drug use: No   Sexual activity: Yes    Partners: Male    Birth control/protection: Surgical    Comment: Tubal Ligation  Other Topics Concern   Not on file  Social History Narrative   Not on file   Social Drivers of Health   Financial Resource Strain: Not on file  Food Insecurity: Not on file  Transportation Needs: Not on file  Physical Activity: Not on file  Stress: Not on file  Social Connections: Not on file  Intimate Partner Violence: Not At Risk (05/25/2021)   Humiliation, Afraid, Rape, and Kick questionnaire    Fear of Current or Ex-Partner: No    Emotionally Abused: No    Physically Abused: No    Sexually Abused: No    Outpatient Medications Prior to Visit  Medication Sig Dispense Refill   amoxicillin -clavulanate (AUGMENTIN ) 875-125 MG tablet Take 1 tablet by mouth every 12 (twelve) hours for 7 days. 14 tablet 0   benzonatate  (TESSALON ) 100 MG capsule Take 2 capsules (200 mg total) by mouth every 8 (eight) hours. 21 capsule 0   ipratropium (ATROVENT )  0.06 % nasal spray Place 2 sprays into both nostrils 4 (four) times daily. 15 mL 12   promethazine -dextromethorphan (PROMETHAZINE -DM) 6.25-15 MG/5ML syrup Take 5 mLs by mouth 4 (four) times daily as needed. 118 mL 0   tiZANidine  (ZANAFLEX ) 2 MG tablet Take 1 tablet (2 mg total) by mouth every 6 (six) hours as needed for muscle spasms. 30 tablet 0   No facility-administered medications prior to visit.      ROS:  Review of Systems  Constitutional:  Negative for fever.  Gastrointestinal:  Negative for blood in stool, constipation, diarrhea, nausea and vomiting.  Genitourinary:  Positive for menstrual problem. Negative for dyspareunia, dysuria, flank pain, frequency, hematuria, urgency, vaginal bleeding, vaginal discharge and vaginal pain.  Musculoskeletal:  Negative for  back pain.  Skin:  Negative for rash.   BREAST: No symptoms   OBJECTIVE:   Vitals:  BP 103/67   Pulse 61   Ht 5' 4 (1.626 m)   Wt 179 lb (81.2 kg)   LMP 08/15/2023 (Exact Date)   BMI 30.73 kg/m   Physical Exam Vitals reviewed.  Constitutional:      Appearance: She is well-developed.  Pulmonary:     Effort: Pulmonary effort is normal.  Musculoskeletal:        General: Normal range of motion.     Cervical back: Normal range of motion.  Skin:    General: Skin is warm and dry.  Neurological:     General: No focal deficit present.     Mental Status: She is alert and oriented to person, place, and time.     Cranial Nerves: No cranial nerve deficit.  Psychiatric:        Mood and Affect: Mood normal.        Behavior: Behavior normal.        Thought Content: Thought content normal.        Judgment: Judgment normal.    Assessment/Plan: Abnormal uterine bleeding (AUB) - Plan: US  PELVIS TRANSVAGINAL NON-OB (TV ONLY); neg labs, check GYN u/s. Will f/u with results and mgmt plan. Discussed POPs, depo, IUD, UFE, myomectomy vs hyst.     Return in about 1 day (around 08/30/2023) for GYN u/s for AUB--[facility] to call pt.  Bay Jarquin B. Kerigan Narvaez, PA-C 08/29/2023 2:38 PM

## 2023-08-26 ENCOUNTER — Encounter: Payer: Self-pay | Admitting: Emergency Medicine

## 2023-08-26 ENCOUNTER — Ambulatory Visit
Admission: EM | Admit: 2023-08-26 | Discharge: 2023-08-26 | Disposition: A | Discharge status: Home / Self Care | Attending: Emergency Medicine | Admitting: Emergency Medicine

## 2023-08-26 DIAGNOSIS — J069 Acute upper respiratory infection, unspecified: Secondary | ICD-10-CM | POA: Insufficient documentation

## 2023-08-26 LAB — GROUP A STREP BY PCR: Group A Strep by PCR: NOT DETECTED

## 2023-08-26 MED ORDER — IPRATROPIUM BROMIDE 0.06 % NA SOLN: 2.0000 | Freq: Four times a day (QID) | NASAL | 12 refills | Status: DC

## 2023-08-26 MED ORDER — PROMETHAZINE-DM 6.25-15 MG/5ML PO SYRP: 5.0000 mL | ORAL_SOLUTION | Freq: Four times a day (QID) | ORAL | 0 refills | Status: DC | PRN

## 2023-08-26 MED ORDER — BENZONATATE 100 MG PO CAPS: 200.0000 mg | ORAL_CAPSULE | Freq: Three times a day (TID) | ORAL | 0 refills | Status: DC

## 2023-08-26 MED ORDER — AMOXICILLIN-POT CLAVULANATE 875-125 MG PO TABS: 1.0000 | ORAL_TABLET | Freq: Two times a day (BID) | ORAL | 0 refills | Status: AC

## 2023-08-26 NOTE — Discharge Instructions (Addendum)
 Your strep test today was negative.  Your exam does reveal that you have an upper respiratory tract infection.  Take the Augmentin 875 mg twice daily with food for 10 days for treatment of your URI.  2 help soothe your throat you may gargle with warm salt water or use over-the-counter Chloraseptic or Sucrets lozenges.  No more than 1 lozenge every 2 hours as the menthol  may give you diarrhea.Use the Atrovent nasal spray, 2 squirts in each nostril every 6 hours, as needed for runny nose and postnasal drip.  Use the Tessalon Perles every 8 hours during the day.  Take them with a small sip of water.  They may give you some numbness to the base of your tongue or a metallic taste in your mouth, this is normal.  Use the Promethazine DM cough syrup at bedtime for cough and congestion.  It will make you drowsy so do not take it during the day.  Return for reevaluation or see your primary care provider for any new or worsening symptoms.

## 2023-08-26 NOTE — ED Provider Notes (Signed)
 [facility]    CSN: [id number] Arrival date & time: 08/26/23  1801      History   Chief Complaint Chief Complaint  Patient presents with   Sore Throat    HPI Wendy Bishop is a 33 y.o. female.   HPI  33 year old female with past medical history significant for obesity and hirsutism presents for evaluation of respiratory symptoms that started a week ago with nasal congestion and a cough that is intermittently productive, especially in the mornings.  She also endorses ear pain, though reports that that is not a new finding for her.  3 days ago she developed a sore throat and she reports that today she looked in the back of her throat and noticed white spots on the back of her tongue and tonsils.  She denies fever or nasal discharge.  Past Medical History:  Diagnosis Date   Abnormal Pap smear of cervix    Complication of anesthesia    pt had nausea with her 2nd C-section   High-risk pregnancy    Premature delivery     Patient Active Problem List   Diagnosis Date Noted   History of bilateral tubal ligation 2018 06/03/2020   Obesity BMI=33.5 06/03/2020   Hirsutism 06/03/2020   Tobacco use 1 ppd 04/07/2016    Past Surgical History:  Procedure Laterality Date   CESAREAN SECTION     CESAREAN SECTION WITH BILATERAL TUBAL LIGATION N/A 05/11/2016   Procedure: CESAREAN SECTION WITH BILATERAL TUBAL LIGATION;  Surgeon: Garnette JONETTA Mace, MD;  Location: [facility];  Service: Obstetrics;  Laterality: N/A;  Baby Boy born @ 21 Apgars: 8/9 Weight: 7lb 8oz   CHOLECYSTECTOMY     FRACTURE SURGERY Left 2013   5th toes fracture wire placed    OB History     Gravida  4   Para  4   Term  3   Preterm  1   AB      Living  3      SAB      IAB      Ectopic      Multiple  0   Live Births  4            Home Medications    Prior to Admission medications   Medication Sig Start Date End Date Taking? Authorizing Provider  amoxicillin-clavulanate  (AUGMENTIN) 875-125 MG tablet Take 1 tablet by mouth every 12 (twelve) hours for 7 days. 08/26/23 09/02/23 Yes Bernardino Ditch, NP  benzonatate (TESSALON) 100 MG capsule Take 2 capsules (200 mg total) by mouth every 8 (eight) hours. 08/26/23  Yes Bernardino Ditch, NP  ipratropium (ATROVENT) 0.06 % nasal spray Place 2 sprays into both nostrils 4 (four) times daily. 08/26/23  Yes Bernardino Ditch, NP  promethazine-dextromethorphan (PROMETHAZINE-DM) 6.25-15 MG/5ML syrup Take 5 mLs by mouth 4 (four) times daily as needed. 08/26/23  Yes Bernardino Ditch, NP  tiZANidine  (ZANAFLEX ) 2 MG tablet Take 1 tablet (2 mg total) by mouth every 6 (six) hours as needed for muscle spasms. 08/11/23   Kotturi, Vinay K, MD    Family History Family History  Problem Relation Age of Onset   Healthy Mother    Healthy Father    Heart disease Maternal Grandmother    Heart disease Maternal Grandfather     Social History Social History   Tobacco Use   Smoking status: Every Day    Current packs/day: 1.00    Average packs/day: 1 pack/day for 12.0 years (12.0 ttl  pk-yrs)    Types: Cigarettes   Smokeless tobacco: Never  Vaping Use   Vaping status: Never Used  Substance Use Topics   Alcohol use: No   Drug use: No     Allergies   Patient has no known allergies.   Review of Systems Review of Systems  Constitutional:  Negative for fever.  HENT:  Positive for congestion, ear pain and sore throat. Negative for rhinorrhea.   Respiratory:  Positive for cough. Negative for shortness of breath and wheezing.      Physical Exam Triage Vital Signs ED Triage Vitals  Encounter Vitals Group     BP      Girls Systolic BP Percentile      Girls Diastolic BP Percentile      Boys Systolic BP Percentile      Boys Diastolic BP Percentile      Pulse      Resp      Temp      Temp src      SpO2      Weight      Height      Head Circumference      Peak Flow      Pain Score      Pain Loc      Pain Education      Exclude from Growth  Chart    No data found.  Updated Vital Signs BP 108/76 (BP Location: Left Arm)   Pulse 64   Temp 99 F (37.2 C) (Oral)   Resp 16   Ht 5' 4 (1.626 m)   Wt 184 lb 1.4 oz (83.5 kg)   LMP 08/15/2023 (Exact Date)   SpO2 100%   BMI 31.60 kg/m   Visual Acuity Right Eye Distance:   Left Eye Distance:   Bilateral Distance:    Right Eye Near:   Left Eye Near:    Bilateral Near:     Physical Exam Vitals and nursing note reviewed.  Constitutional:      Appearance: Normal appearance. She is not ill-appearing.  HENT:     Head: Normocephalic and atraumatic.     Right Ear: Tympanic membrane, ear canal and external ear normal. There is no impacted cerumen.     Left Ear: Tympanic membrane, ear canal and external ear normal. There is no impacted cerumen.     Nose: Congestion and rhinorrhea present.     Comments: Please mucosa is edematous and erythematous with yellow discharge in both nares.    Mouth/Throat:     Mouth: Mucous membranes are moist.     Pharynx: Oropharynx is clear. Posterior oropharyngeal erythema present. No oropharyngeal exudate.     Comments: Soft palate and tonsillar pillars are erythematous and tonsillar pillars are 1+ edematous.  No visible exudate. Cardiovascular:     Rate and Rhythm: Normal rate and regular rhythm.     Pulses: Normal pulses.     Heart sounds: Normal heart sounds. No murmur heard.    No friction rub. No gallop.  Pulmonary:     Effort: Pulmonary effort is normal.     Breath sounds: Normal breath sounds. No wheezing, rhonchi or rales.  Musculoskeletal:     Cervical back: Normal range of motion and neck supple. No tenderness.  Lymphadenopathy:     Cervical: No cervical adenopathy.  Skin:    General: Skin is warm and dry.     Capillary Refill: Capillary refill takes less than 2 seconds.     Findings: No  rash.  Neurological:     General: No focal deficit present.     Mental Status: She is alert and oriented to person, place, and time.       UC Treatments / Results  Labs (all labs ordered are listed, but only abnormal results are displayed) Labs Reviewed  GROUP A STREP BY PCR    EKG   Radiology No results found.  Procedures Procedures (including critical care time)  Medications Ordered in UC Medications - No data to display  Initial Impression / Assessment and Plan / UC Course  I have reviewed the triage vital signs and the nursing notes.  Pertinent labs & imaging results that were available during my care of the patient were reviewed by me and considered in my medical decision making (see chart for details).   Patient is a nontoxic-appearing 33 year old female presenting for evaluation of 1 week worth of respiratory symptoms as outlined in HPI above.  Her most prominent symptom, and the reason she came in, is because of a sore throat which began 3 days ago.  She also reports that she saw white spots in the back of her throat and was concerned.  On exam she does have edematous and erythematous tonsillar pillars as well as erythema to the soft palate but I do not appreciate any exudate.  Also no cervical lymphadenopathy present on exam.  The remainder of her upper respiratory tract does reveal inflamed nasal mucosa with thick nasal discharge.  Cardiopulmonary exam reveals clear lung sounds in all fields.  Differential diagnose include URI with cough and congestion and strep pharyngitis.  I will order a strep PCR.  Strep PCR is negative.  I will discharge patient on the diagnosis of URI with cough and congestion start her on Augmentin 875 twice daily with food for 7 days.  Also try nasal spray for nasal congestion.  Tessalon Perles and Promethazine DM cough syrup for cough and congestion.   Final Clinical Impressions(s) / UC Diagnoses   Final diagnoses:  URI with cough and congestion     Discharge Instructions      Your strep test today was negative.  Your exam does reveal that you have an upper  respiratory tract infection.  Take the Augmentin 875 mg twice daily with food for 10 days for treatment of your URI.  2 help soothe your throat you may gargle with warm salt water or use over-the-counter Chloraseptic or Sucrets lozenges.  No more than 1 lozenge every 2 hours as the menthol  may give you diarrhea.Use the Atrovent nasal spray, 2 squirts in each nostril every 6 hours, as needed for runny nose and postnasal drip.  Use the Tessalon Perles every 8 hours during the day.  Take them with a small sip of water.  They may give you some numbness to the base of your tongue or a metallic taste in your mouth, this is normal.  Use the Promethazine DM cough syrup at bedtime for cough and congestion.  It will make you drowsy so do not take it during the day.  Return for reevaluation or see your primary care provider for any new or worsening symptoms.      ED Prescriptions     Medication Sig Dispense Auth. Provider   amoxicillin-clavulanate (AUGMENTIN) 875-125 MG tablet Take 1 tablet by mouth every 12 (twelve) hours for 7 days. 14 tablet Bernardino Ditch, NP   benzonatate (TESSALON) 100 MG capsule Take 2 capsules (200 mg total) by mouth every 8 (eight)  hours. 21 capsule Bernardino Ditch, NP   ipratropium (ATROVENT) 0.06 % nasal spray Place 2 sprays into both nostrils 4 (four) times daily. 15 mL Bernardino Ditch, NP   promethazine-dextromethorphan (PROMETHAZINE-DM) 6.25-15 MG/5ML syrup Take 5 mLs by mouth 4 (four) times daily as needed. 118 mL Bernardino Ditch, NP      PDMP not reviewed this encounter.   Bernardino Ditch, NP 08/26/23 [phone]

## 2023-08-26 NOTE — ED Triage Notes (Signed)
 Pt c/o sore throat. Started about 3 days ago. Pt also has a cough and nasal congestion for about a week. She states the back of her throat and tongue has had white spots.

## 2023-08-29 ENCOUNTER — Encounter: Payer: Self-pay | Admitting: Obstetrics and Gynecology

## 2023-08-29 ENCOUNTER — Ambulatory Visit: Admitting: Obstetrics and Gynecology

## 2023-08-29 VITALS — BP 103/67 | HR 61 | Ht 64.0 in | Wt 179.0 lb

## 2023-08-29 DIAGNOSIS — N939 Abnormal uterine and vaginal bleeding, unspecified: Secondary | ICD-10-CM

## 2023-08-29 NOTE — Patient Instructions (Signed)
 I value your feedback and you entrusting [place] with your care. If you get a [place] patient survey, I would appreciate you taking the time to let us know about your experience today. Thank you! ? ? ?

## 2023-09-06 ENCOUNTER — Ambulatory Visit

## 2023-09-06 ENCOUNTER — Telehealth: Payer: Self-pay | Admitting: Obstetrics and Gynecology

## 2023-09-06 DIAGNOSIS — N939 Abnormal uterine and vaginal bleeding, unspecified: Secondary | ICD-10-CM

## 2023-09-06 NOTE — Telephone Encounter (Signed)
 Pt aware of neg GYN u/s results for AUB and neg labs. Most likely hormonal. Discussed prog only options/ablation. Pt will consider but prob wants nexplanon again. Had light spotting with it in past. Will let me know and then RTO with menses for insertion. Pt is s/p TL.

## 2023-09-26 NOTE — Progress Notes (Unsigned)
   No chief complaint on file.    HPI:  Wendy Bishop is a 33 y.o. [phone] here for Nexplanon  insertion. No GYN concerns.  Having irreuglar menses***/ neg GYN u/s  There were no vitals taken for this visit.   Nexplanon  Insertion  Patient given informed consent, signed copy in the chart, time out was performed. Pregnancy test was neg. *** Appropriate time out taken.  Patient's LEFT/RIGHT *** arm was prepped and draped in the usual sterile fashion. The ruler used to measure and mark insertion area.  Pt was prepped with betadine swab and then injected with 1.0 cc of 2% lidocaine with epinephrine. Nexplanon  removed form packaging,  Device confirmed in needle, then inserted full length of needle and withdrawn per handbook instructions.  Pt insertion site covered with steri-strip and a bandage.   Minimal blood loss.  Pt tolerated the procedure welL.  Assessment: No diagnosis found.   No orders of the defined types were placed in this encounter.    Plan:   She was told to remove the dressing in 12-24 hours, to keep the incision area dry for 24 hours and to remove the Steristrip in 2-3  days.  Notify us  if any signs of tenderness, redness, pain, or fevers develop.   Dayanara Sherrill B. Ople Girgis, PA-C 09/26/2023 8:12 PM

## 2023-09-27 ENCOUNTER — Encounter: Payer: Self-pay | Admitting: Obstetrics and Gynecology

## 2023-09-27 ENCOUNTER — Ambulatory Visit (INDEPENDENT_AMBULATORY_CARE_PROVIDER_SITE_OTHER): Admitting: Obstetrics and Gynecology

## 2023-09-27 VITALS — BP 98/66 | HR 84 | Ht 64.0 in | Wt 181.0 lb

## 2023-09-27 DIAGNOSIS — Z30017 Encounter for initial prescription of implantable subdermal contraceptive: Secondary | ICD-10-CM

## 2023-09-27 MED ORDER — ETONOGESTREL 68 MG ~~LOC~~ IMPL
68.0000 mg | DRUG_IMPLANT | Freq: Once | SUBCUTANEOUS | Status: AC
  Administered 2023-09-27: 68 mg via SUBCUTANEOUS

## 2023-09-27 NOTE — Patient Instructions (Signed)
I value your feedback and you entrusting [place] with your care. If you get a [facility] patient survey, I would appreciate you taking the time to let us know about your experience today. Thank you!  Remove the dressing in 24 hours,  keep the incision area dry for 24 hours and remove the Steristrip in 2-3  days.  Notify us if any signs of tenderness, redness, pain, or fevers develop.

## 2023-10-08 ENCOUNTER — Ambulatory Visit
Admission: EM | Admit: 2023-10-08 | Discharge: 2023-10-08 | Disposition: A | Discharge status: Home / Self Care | Attending: Family Medicine | Admitting: Family Medicine

## 2023-10-08 ENCOUNTER — Encounter: Payer: Self-pay | Admitting: Emergency Medicine

## 2023-10-08 DIAGNOSIS — U071 COVID-19: Secondary | ICD-10-CM | POA: Diagnosis present

## 2023-10-08 LAB — SARS CORONAVIRUS 2 BY RT PCR: SARS Coronavirus 2 by RT PCR: POSITIVE — AB

## 2023-10-08 NOTE — Discharge Instructions (Signed)

## 2023-10-08 NOTE — ED Triage Notes (Signed)
 Patient c/o headache, cough, congestion, and bodyaches that started on Thursday.  Patient unsure of fevers.

## 2023-10-08 NOTE — ED Provider Notes (Signed)
 [facility]    CSN: [id number] Arrival date & time: 10/08/23  1202      History   Chief Complaint Chief Complaint  Patient presents with   Headache   Generalized Body Aches   Cough    HPI Wendy Bishop is a 33 y.o. female.   HPI  History obtained from the patient. Wendy Bishop presents for 2 days of headache, body aches, nasal congestion, cough, chills, rhinorrhea and sneezing. Has some right ear pain.  Took Excedrin for a headache. Took 2 home COVID that were positive but they were out of date. Her co-workers have COVID.  She came to the urgent care to be sure those tests are accurate.  She has felt warm but doesn't have a thermometer at home to check.  Denies vomiting or diarrhea.    Past Medical History:  Diagnosis Date   Abnormal Pap smear of cervix    Complication of anesthesia    pt had nausea with her 2nd C-section   High-risk pregnancy    Premature delivery     Patient Active Problem List   Diagnosis Date Noted   History of bilateral tubal ligation 2018 06/03/2020   Obesity BMI=33.5 06/03/2020   Hirsutism 06/03/2020   Tobacco use 1 ppd 04/07/2016    Past Surgical History:  Procedure Laterality Date   CESAREAN SECTION     CESAREAN SECTION WITH BILATERAL TUBAL LIGATION N/A 05/11/2016   Procedure: CESAREAN SECTION WITH BILATERAL TUBAL LIGATION;  Surgeon: Wendy JONETTA Mace, MD;  Location: [facility] ORS;  Service: Obstetrics;  Laterality: N/A;  Baby Boy born @ 72 Apgars: 8/9 Weight: 7lb 8oz   CHOLECYSTECTOMY     FRACTURE SURGERY Left 2013   5th toes fracture wire placed    OB History     Gravida  4   Para  4   Term  3   Preterm  1   AB      Living  3      SAB      IAB      Ectopic      Multiple  0   Live Births  4            Home Medications    Prior to Admission medications   Medication Sig Start Date End Date Taking? Authorizing Provider  etonogestrel  (NEXPLANON ) 68 MG IMPL implant 1 each by Subdermal route once.  09/27/23   Copland, Bernarda NOVAK, PA-C    Family History Family History  Problem Relation Age of Onset   Healthy Mother    Healthy Father    Heart disease Maternal Grandmother    Heart disease Maternal Grandfather     Social History Social History   Tobacco Use   Smoking status: Every Day    Current packs/day: 1.00    Average packs/day: 1 pack/day for 12.0 years (12.0 ttl pk-yrs)    Types: Cigarettes   Smokeless tobacco: Never  Vaping Use   Vaping status: Never Used  Substance Use Topics   Alcohol use: No   Drug use: No     Allergies   Patient has no known allergies.   Review of Systems Review of Systems: negative unless otherwise stated in HPI.      Physical Exam Triage Vital Signs ED Triage Vitals  Encounter Vitals Group     BP 10/08/23 1213 113/73     Girls Systolic BP Percentile --      Girls Diastolic BP Percentile --  Boys Systolic BP Percentile --      Boys Diastolic BP Percentile --      Pulse Rate 10/08/23 1213 64     Resp 10/08/23 1213 14     Temp 10/08/23 1213 98.4 F (36.9 C)     Temp Source 10/08/23 1213 Oral     SpO2 10/08/23 1213 100 %     Weight 10/08/23 1212 181 lb (82.1 kg)     Height 10/08/23 1212 5' 4 (1.626 m)     Head Circumference --      Peak Flow --      Pain Score 10/08/23 1211 0     Pain Loc --      Pain Education --      Exclude from Growth Chart --    No data found.  Updated Vital Signs BP 113/73 (BP Location: Right Arm)   Pulse 64   Temp 98.4 F (36.9 C) (Oral)   Resp 14   Ht 5' 4 (1.626 m)   Wt 82.1 kg   LMP 09/14/2023 (Exact Date)   SpO2 100%   BMI 31.07 kg/m   Visual Acuity Right Eye Distance:   Left Eye Distance:   Bilateral Distance:    Right Eye Near:   Left Eye Near:    Bilateral Near:     Physical Exam GEN:     alert, non-toxic appearing female in no distress    HENT:  mucus membranes moist, oropharyngeal without lesions or erythema, no tonsillar hypertrophy or exudates,  clear nasal  discharge, bilateral TM normal EYES:   pupils equal and reactive, no scleral injection or discharge NECK:  normal ROM, +lymphadenopathy, no meningismus   RESP:  no increased work of breathing, clear to auscultation bilaterally CVS:   regular rate and rhythm Skin:   warm and dry, no rash on visible skin    UC Treatments / Results  Labs (all labs ordered are listed, but only abnormal results are displayed) Labs Reviewed  SARS CORONAVIRUS 2 BY RT PCR - Abnormal; Notable for the following components:      Result Value   SARS Coronavirus 2 by RT PCR POSITIVE (*)    All other components within normal limits    EKG   Radiology No results found.   Procedures Procedures (including critical care time)  Medications Ordered in UC Medications - No data to display  Initial Impression / Assessment and Plan / UC Course  I have reviewed the triage vital signs and the nursing notes.  Pertinent labs & imaging results that were available during my care of the patient were reviewed by me and considered in my medical decision making (see chart for details).       Pt is a 33 y.o. female who presents for 2 days of respiratory symptoms. Wendy Bishop is afebrile here without recent antipyretics. Satting well on room air. Overall pt is non-toxic appearing, well hydrated, without respiratory distress. Pulmonary exam is unremarkable.  COVID obtained and was positive.  Declined antiviral treatment. Discussed symptomatic treatment.  Explained lack of efficacy of antibiotics in viral disease.  Typical duration of symptoms discussed. Work note provided.   Return and ED precautions given and voiced understanding. Discussed MDM, treatment plan and plan for follow-up with patient who agrees with plan.     Final Clinical Impressions(s) / UC Diagnoses   Final diagnoses:  COVID-19     Discharge Instructions      Your test for COVID-19 was positive, meaning that you  were infected with the novel coronavirus  and could give the germ to others.  The recommendations suggest returning to normal activities when, for at least 24 hours, symptoms are improving overall, and if a fever was present, it has been gone without use of a fever-reducing medication.  You should wear a mask for the next 5 days to prevent the spread of disease. Please continue good preventive care measures, including:  frequent hand-washing, avoid touching your face, cover coughs/sneezes, stay out of crowds and keep a 6 foot distance from others.  Go to the nearest hospital emergency room if fever/cough/breathlessness are severe or illness seems like a threat to life.  If your were prescribed medication. Stop by the pharmacy to pick it up. You can take Tylenol  and/or Ibuprofen  as needed for fever reduction and pain relief.    For cough: honey 1/2 to 1 teaspoon (you can dilute the honey in water or another fluid).  You can also use guaifenesin and dextromethorphan for cough. You can use a humidifier for chest congestion and cough.  If you don't have a humidifier, you can sit in the bathroom with the hot shower running.      For sore throat: try warm salt water gargles, Mucinex sore throat cough drops or cepacol lozenges, throat spray, warm tea or water with lemon/honey, popsicles or ice, or OTC cold relief medicine for throat discomfort. You can also purchase chloraseptic spray at the pharmacy or dollar store.   For congestion: take a daily anti-histamine like Zyrtec, Claritin, and a oral decongestant, such as pseudoephedrine.  You can also use Flonase 1-2 sprays in each nostril daily. Afrin is also a good option, if you do not have high blood pressure.    It is important to stay hydrated: drink plenty of fluids (water, gatorade/powerade/pedialyte, juices, or teas) to keep your throat moisturized and help further relieve irritation/discomfort.    Return or go to the Emergency Department if symptoms worsen or do not improve in the next few  days      ED Prescriptions   None    PDMP not reviewed this encounter.   Uchechi Denison, DO 10/08/23 1247

## 2023-10-27 ENCOUNTER — Other Ambulatory Visit: Payer: Self-pay | Admitting: Family Medicine

## 2023-10-27 ENCOUNTER — Ambulatory Visit (INDEPENDENT_AMBULATORY_CARE_PROVIDER_SITE_OTHER): Admitting: Family Medicine

## 2023-10-27 ENCOUNTER — Telehealth: Payer: Self-pay | Admitting: Family Medicine

## 2023-10-27 ENCOUNTER — Encounter: Payer: Self-pay | Admitting: Family Medicine

## 2023-10-27 ENCOUNTER — Ambulatory Visit
Admission: EM | Admit: 2023-10-27 | Discharge: 2023-10-27 | Disposition: A | Discharge status: Home / Self Care | Attending: Family Medicine | Admitting: Family Medicine

## 2023-10-27 ENCOUNTER — Ambulatory Visit: Payer: Self-pay

## 2023-10-27 VITALS — BP 112/74 | HR 84 | Temp 98.8°F | Ht 64.0 in | Wt 178.0 lb

## 2023-10-27 DIAGNOSIS — R051 Acute cough: Secondary | ICD-10-CM

## 2023-10-27 DIAGNOSIS — U071 COVID-19: Secondary | ICD-10-CM | POA: Diagnosis not present

## 2023-10-27 DIAGNOSIS — F17210 Nicotine dependence, cigarettes, uncomplicated: Secondary | ICD-10-CM

## 2023-10-27 DIAGNOSIS — J209 Acute bronchitis, unspecified: Secondary | ICD-10-CM | POA: Diagnosis not present

## 2023-10-27 MED ORDER — BENZONATATE 200 MG PO CAPS: 200.0000 mg | ORAL_CAPSULE | Freq: Three times a day (TID) | ORAL | 0 refills | Status: DC | PRN

## 2023-10-27 MED ORDER — ALBUTEROL SULFATE HFA 108 (90 BASE) MCG/ACT IN AERS: 2.0000 | INHALATION_SPRAY | RESPIRATORY_TRACT | 0 refills | Status: AC | PRN

## 2023-10-27 MED ORDER — PREDNISONE 50 MG PO TABS
50.0000 mg | ORAL_TABLET | Freq: Every day | ORAL | 0 refills | Status: AC
Start: 2023-10-27 — End: 2023-11-01

## 2023-10-27 NOTE — Telephone Encounter (Unsigned)
 Copied from CRM #[id number]. Topic: Clinical - Red Word Triage >> Oct 27, 2023 11:22 AM Wendy Bishop wrote: Red Word that prompted transfer to Nurse Triage: Previously had covid, and since then a month ago and still feels ill. Did take mucinex and is coughing so bad throwing up blood with no fever and congestion and wheezing a bit. Covid 9/6 Spoke to nurse [facility]

## 2023-10-27 NOTE — Telephone Encounter (Signed)
 Appointment today 10/27/2023 at 2pm with patient's PCP Dr Mackey Ny  FYI Only or Action Required?: FYI only for provider.  Patient was last seen in primary care on 08/09/2023 by Kotturi, Vinay K, MD.  Called Nurse Triage reporting Cough.  Symptoms began about a month ago.  Interventions attempted: Rest, hydration, or home remedies.  Symptoms are: unchanged.  Triage Disposition: See HCP Within 4 Hours (Or PCP Triage)  Patient/caregiver understands and will follow disposition?: Yes                    Copied from CRM [id number]. Topic: Clinical - Red Word Triage >> Oct 27, 2023 11:24 AM Olam RAMAN wrote: Red Word that prompted transfer to Nurse Triage: Previously had covid, and since then a month ago and still feels ill. Did take mucinex and is coughing so bad throwing up blood with no fever and congestion and wheezing a bit. Covid 9/6    Reason for Disposition . [1] MILD difficulty breathing (e.g., minimal/no SOB at rest, SOB with walking, pulse < 100) AND [2] still present when not coughing  Answer Assessment - Initial Assessment Questions Had covid a month Sent home from work today Cough--productive with green sputum Denies coughing up blood Patient is advised that if anything worsens to go to the Emergency Room. Patient verbalized understanding.   1. ONSET: When did the cough begin?      About a month 2. SEVERITY: How bad is the cough today?      continuous 3. SPUTUM: Describe the color of your sputum (e.g., none, dry cough; clear, white, yellow, green)     Green and a little clear 4. HEMOPTYSIS: Are you coughing up any blood? If Yes, ask: How much? (e.g., flecks, streaks, tablespoons, etc.)     no 5. DIFFICULTY BREATHING: Are you having difficulty breathing? If Yes, ask: How bad is it? (e.g., mild, moderate, severe)      yes 6. FEVER: Do you have a fever? If Yes, ask: What is your temperature, how was it measured, and when did it start?      no 7. CARDIAC HISTORY: Do you have any history of heart disease? (e.g., heart attack, congestive heart failure)      no 8. LUNG HISTORY: Do you have any history of lung disease?  (e.g., pulmonary embolus, asthma, emphysema)     no 9. PE RISK FACTORS: Do you have a history of blood clots? (or: recent major surgery, recent prolonged travel, bedridden)     no 10. OTHER SYMPTOMS: Do you have any other symptoms? (e.g., runny nose, wheezing, chest pain)       Coughing fits, shortness of breath with coughing fits 11. PREGNANCY: Is there any chance you are pregnant? When was your last menstrual period?       No--recent  Protocols used: Cough - Acute Non-Productive-A-AH

## 2023-10-27 NOTE — Discharge Instructions (Signed)
 Stop by the pharmacy to pick up your prescriptions.  Follow up with your primary care provider or return to the urgent care, if not improving.

## 2023-10-27 NOTE — Telephone Encounter (Unsigned)
 Copied from CRM [id number]. Topic: General - Other >> Oct 27, 2023 11:17 AM Wendy Bishop wrote: Reason for CRM: Previously had covid, and since then a month ago and still feels ill. Did take mucinex and is coughing so bad throwing up blood with no fever and congestion and wheezing a bit. Covid 9/6 Red word

## 2023-10-27 NOTE — Telephone Encounter (Unsigned)
 Copied from CRM #[id number]. Topic: General - Other >> Oct 27, 2023 11:25 AM Olam RAMAN wrote: Reason for CRM: Previously had covid, and since then a month ago and still feels ill. Did take mucinex and is coughing so bad throwing up blood with no fever and congestion and wheezing a bit. Covid 9/6

## 2023-10-27 NOTE — Progress Notes (Signed)
   Acute Office Visit  Subjective:     Patient ID: Wendy Bishop, female    DOB: 1990/10/19, 33 y.o.   MRN: [id number]  Chief Complaint  Patient presents with   Cough    Recent positive for COVID sep. 6th, coughing up clear/greenish phlegm, loss of appetite     HPI Patient is in today for   Discussed the use of AI scribe software for clinical note transcription with the patient, who gave verbal consent to proceed.  History of Present Illness Wendy Bishop is a 33 year old female who presents with persistent cough and congestion.  She has been experiencing a severe cough for about a week, which often wakes her up at night. She describes it as 'coughing my head off' while trying to expectorate. Some mucus is produced, but not consistently.  While at work today, she became overheated and began coughing intensely, leading to vomiting and difficulty catching her breath. This incident resulted in her being sent home after a colleague reported her condition.  She has a history of COVID-19, and her symptoms have lingered, including a persistent cough and episodes of fatigue. No fever, worsening shortness of breath, or chest pain have been reported.  No blood in her sputum.    Review of Systems  All other systems reviewed and are negative.       Objective:    BP 112/74   Pulse 84   Temp 98.8 F (37.1 C) (Oral)   Ht 5' 4 (1.626 m)   Wt 178 lb (80.7 kg)   LMP 09/14/2023 (Exact Date)   SpO2 98%   BMI 30.55 kg/m    Physical Exam Vitals and nursing note reviewed.  Constitutional:      Appearance: Normal appearance.  HENT:     Head: Normocephalic.     Right Ear: External ear normal.     Left Ear: External ear normal.  Eyes:     Conjunctiva/sclera: Conjunctivae normal.  Cardiovascular:     Rate and Rhythm: Normal rate.  Pulmonary:     Effort: Pulmonary effort is normal. No respiratory distress.  Abdominal:     Palpations: Abdomen is soft.  Musculoskeletal:         General: Normal range of motion.  Skin:    General: Skin is warm.  Neurological:     Mental Status: She is alert and oriented to person, place, and time.  Psychiatric:        Mood and Affect: Mood normal.     No results found for any visits on 10/27/23.      Assessment & Plan:   Problem List Items Addressed This Visit   None Visit Diagnoses       Acute cough    -  Primary     COVID         Assessment and Plan Assessment & Plan Persistent cough and congestion post-COVID-19 infection Persistent cough and congestion post-COVID-19 infection, likely post-viral bronchitis. Adequate oxygenation, no hemoptysis, no immediate pneumonia signs. - Prescribed Tessalon  pearls for cough management. - Recommended Robitussin for symptomatic relief and expectoration. - Order chest x-ray if no improvement in one week. - Advised monitoring for fever, worsening dyspnea, or chest pain. - Provided doctor's note for work absence.    No orders of the defined types were placed in this encounter.   No follow-ups on file.  Vinary K Navie Lamoreaux, MD

## 2023-10-27 NOTE — ED Provider Notes (Signed)
 [facility]    CSN: [id number] Arrival date & time: 10/27/23  1631      History   Chief Complaint Chief Complaint  Patient presents with   Cough   Nasal Congestion    HPI Wendy Bishop is a 33 y.o. female.   HPI  History obtained from the patient. Wendy Bishop presents for cough for the past  week after having COVID 3 weeks ago. She coughed so much that she vomiting.  She was sent home from work due to her cough. Has been wheezing and starting get short of breath earlier today. She doesn't have an inhaler at home.  No fever.   No history of asthma. She smokes cigarettes (~1ppd) for at least for 20 years.        Past Medical History:  Diagnosis Date   Abnormal Pap smear of cervix    Complication of anesthesia    pt had nausea with her 2nd C-section   High-risk pregnancy    Premature delivery     Patient Active Problem List   Diagnosis Date Noted   History of bilateral tubal ligation 2018 06/03/2020   Obesity BMI=33.5 06/03/2020   Hirsutism 06/03/2020   Tobacco use 1 ppd 04/07/2016    Past Surgical History:  Procedure Laterality Date   CESAREAN SECTION     CESAREAN SECTION WITH BILATERAL TUBAL LIGATION N/A 05/11/2016   Procedure: CESAREAN SECTION WITH BILATERAL TUBAL LIGATION;  Surgeon: Garnette JONETTA Mace, MD;  Location: [facility] ORS;  Service: Obstetrics;  Laterality: N/A;  Baby Boy born @ 9 Apgars: 8/9 Weight: 7lb 8oz   CHOLECYSTECTOMY     FRACTURE SURGERY Left 2013   5th toes fracture wire placed    OB History     Gravida  4   Para  4   Term  3   Preterm  1   AB      Living  3      SAB      IAB      Ectopic      Multiple  0   Live Births  4            Home Medications    Prior to Admission medications   Medication Sig Start Date End Date Taking? Authorizing Provider  albuterol  (VENTOLIN  HFA) 108 (90 Base) MCG/ACT inhaler Inhale 2 puffs into the lungs every 4 (four) hours as needed. 10/27/23  Yes Devlin Mcveigh, DO   predniSONE  (DELTASONE ) 50 MG tablet Take 1 tablet (50 mg total) by mouth daily for 5 days. 10/27/23 11/01/23 Yes Nedra Mcinnis, DO  benzonatate  (TESSALON ) 200 MG capsule Take 1 capsule (200 mg total) by mouth 3 (three) times daily as needed for cough. Patient not taking: Reported on 10/27/2023 10/27/23   Kotturi, Vinay K, MD  etonogestrel  (NEXPLANON ) 68 MG IMPL implant 1 each by Subdermal route once. 09/27/23   Copland, Bernarda NOVAK, PA-C    Family History Family History  Problem Relation Age of Onset   Healthy Mother    Healthy Father    Heart disease Maternal Grandmother    Heart disease Maternal Grandfather     Social History Social History   Tobacco Use   Smoking status: Every Day    Current packs/day: 1.00    Average packs/day: 1 pack/day for 12.0 years (12.0 ttl pk-yrs)    Types: Cigarettes   Smokeless tobacco: Never  Vaping Use   Vaping status: Never Used  Substance Use Topics   Alcohol use:  No   Drug use: No     Allergies   Patient has no known allergies.   Review of Systems Review of Systems: negative unless otherwise stated in HPI.      Physical Exam Triage Vital Signs ED Triage Vitals  Encounter Vitals Group     BP      Girls Systolic BP Percentile      Girls Diastolic BP Percentile      Boys Systolic BP Percentile      Boys Diastolic BP Percentile      Pulse      Resp      Temp      Temp src      SpO2      Weight      Height      Head Circumference      Peak Flow      Pain Score      Pain Loc      Pain Education      Exclude from Growth Chart    No data found.  Updated Vital Signs BP 106/73 (BP Location: Right Arm)   Pulse 76   Temp 98.8 F (37.1 C) (Oral)   Resp 16   LMP 10/13/2023 (Approximate)   SpO2 95%   Visual Acuity Right Eye Distance:   Left Eye Distance:   Bilateral Distance:    Right Eye Near:   Left Eye Near:    Bilateral Near:     Physical Exam GEN:     alert, non-toxic appearing female in no distress    HENT:   mucus membranes moist, no nasal discharge EYES:   pupils equal and reactive, no scleral injection or discharge NECK:  normal ROM, no lymphadenopathy, no meningismus   RESP:  no increased work of breathing, rhonchi and expiratory wheezing bilaterally, no rales CVS:   regular rate and rhythm Skin:   warm and dry, no rash on visible skin    UC Treatments / Results  Labs (all labs ordered are listed, but only abnormal results are displayed) Labs Reviewed - No data to display  EKG   Radiology No results found.   Procedures Procedures (including critical care time)  Medications Ordered in UC Medications - No data to display  Initial Impression / Assessment and Plan / UC Course  I have reviewed the triage vital signs and the nursing notes.  Pertinent labs & imaging results that were available during my care of the patient were reviewed by me and considered in my medical decision making (see chart for details).       Pt is a 33 y.o. female who smokes cigarettes presents for 1 week of cough that is not improving.  Nanie is  afebrile here without recent antipyretics. Satting adequately on room air. Overall pt is  non-toxic appearing, well hydrated, without respiratory distress. Pulmonary exam is remarkable for scattered expiratory wheezing and rhonchi that clear with cough.  After shared decision making, we will not pursue chest x-ray.  COVID  and influenza testing deferred due to length of symptoms. Pt diagnosed with COVID on 10/08/23.    Treat acute viral bronchitis with steroids  as below. Albuterol  for shortness of breath/   Continue home Promethazine  DM cough syrup given for cough and allow patient to rest.  Typical duration of symptoms discussed. Return and ED precautions given and patient voiced understanding.   Discussed MDM, treatment plan and plan for follow-up with patient who agrees with plan.  Final Clinical Impressions(s) / UC Diagnoses   Final diagnoses:  Acute  bronchitis, unspecified organism  Cigarette smoker     Discharge Instructions      Stop by the pharmacy to pick up your prescriptions.  Follow up with your primary care provider or return to the urgent care, if not improving.       ED Prescriptions     Medication Sig Dispense Auth. Provider   predniSONE  (DELTASONE ) 50 MG tablet Take 1 tablet (50 mg total) by mouth daily for 5 days. 5 tablet Emari Hreha, DO   albuterol  (VENTOLIN  HFA) 108 (90 Base) MCG/ACT inhaler Inhale 2 puffs into the lungs every 4 (four) hours as needed. 6.7 g Shantanu Strauch, DO      PDMP not reviewed this encounter.   Dioselina Brumbaugh, DO 10/27/23 1710

## 2023-10-27 NOTE — ED Triage Notes (Signed)
 Cough, congestion x  1 week. Taking mucinex.

## 2023-10-27 NOTE — Telephone Encounter (Signed)
 Noted  Pt has appt.  KP

## 2023-10-27 NOTE — Telephone Encounter (Signed)
 She can try OTC cough syrup : Robitussin, I also called in Tessalon  pearls, she can try taking those to help with her cough.

## 2023-10-27 NOTE — Telephone Encounter (Unsigned)
 Copied from CRM [id number]. Topic: Clinical - Red Word Triage >> Oct 27, 2023 11:24 AM Olam RAMAN wrote: Red Word that prompted transfer to Nurse Triage: Previously had covid, and since then a month ago and still feels ill. Did take mucinex and is coughing so bad throwing up blood with no fever and congestion and wheezing a bit. Covid 9/6

## 2023-10-31 ENCOUNTER — Telehealth: Payer: Self-pay | Admitting: Family Medicine

## 2023-10-31 MED ORDER — AZITHROMYCIN 250 MG PO TABS: ORAL_TABLET | ORAL | 0 refills | Status: DC

## 2023-10-31 NOTE — Telephone Encounter (Signed)
 Pt called for follow up requesting antibiotics. As discussed the with patient, will send in antibiotics as her symptoms have not improved in 10 days of symptoms. She was given prednisone  and albuterol  at her previous encounter.   Caprice Porteous, DO

## 2023-11-03 ENCOUNTER — Telehealth: Payer: Self-pay | Admitting: Family Medicine

## 2023-11-03 NOTE — Telephone Encounter (Signed)
 Copied from CRM [id number]. Topic: Appointments - Transfer of Care >> Nov 03, 2023  3:01 PM Wendy Bishop wrote: Pt is requesting to transfer FROM: Dr Mackey Ny Pt is requesting to transfer TO: PA Toribio Hoyle Reason for requested transfer: The patient is not happy with current provider It is the responsibility of the team the patient would like to transfer to [name]) to reach out to the patient if for any reason this transfer is not acceptable.  If the patients request for trasnfer of care is accepted she would also like to change her appointment in January from Dr Ny to Rolan Hoyle. Please assist patient further.

## 2023-11-04 NOTE — Telephone Encounter (Signed)
Ok with switch  

## 2023-11-10 ENCOUNTER — Ambulatory Visit: Admitting: Family Medicine

## 2023-12-14 NOTE — Progress Notes (Signed)
    GYNECOLOGY OFFICE PROCEDURE NOTE  SUBJECTIVE Wendy Bishop is a 33 y.o. [phone] here for Nexplanon  removal.  Nexplanon  inserted 09/27/23 by Bernarda Perfect PA-C for AUB.  At that time she was having prolonged 14 day periods with heavy bleeding, no pain.  She has a history of migraines with aura and is a current tobacco user.  She requested Nexplanon  because she had used it in the past with only spotting.  Last pap smear was on 05/27/2023 and was normal.  Normal TVUS 09/06/23.  Pt is s/p BTL.     OBJECTIVE BP 119/78   Pulse 69   Wt 178 lb (80.7 kg)   BMI 30.55 kg/m   PHYSICAL EXAM GEN: A&O, NAD RESP: Normal work of breathing PELVIC: deferred EXT: No edema NEURO: No focal deficit  Nexplanon  Removal Patient identified, informed consent performed, consent signed.   Appropriate time out taken. Nexplanon  site identified.  Area prepped in usual sterile fashon. Three ml of 1% lidocaine was used to anesthetize the area at the distal end of the implant. A small stab incision was made right beside the implant on the distal portion.  The Nexplanon  rod was grasped using hemostats and removed without difficulty.  There was minimal blood loss. There were no complications.  Steri-strips were applied over the small incision.  A pressure bandage was applied to reduce any bruising.  The patient tolerated the procedure well and was given post procedure instructions.  Patient is s/p BTL for contraception.   ASSESSMENT/PLAN 33 y.o. [id number] s/p Nexplanon  removal without complication.  -Aftercare instructions reviewed, handout provided.  -Has BTL as contraception -Tylenol /Ibuprofen  for soreness -Give periods 12 weeks to normalize -- RTC to discuss AUB if needed  Follow up prn.   Estil Mangle, DO [facility]

## 2023-12-15 ENCOUNTER — Ambulatory Visit: Admitting: Obstetrics

## 2023-12-15 ENCOUNTER — Encounter: Payer: Self-pay | Admitting: Obstetrics

## 2023-12-15 VITALS — BP 119/78 | HR 69 | Wt 178.0 lb

## 2023-12-15 DIAGNOSIS — Z3046 Encounter for surveillance of implantable subdermal contraceptive: Secondary | ICD-10-CM | POA: Diagnosis not present

## 2024-02-09 ENCOUNTER — Ambulatory Visit: Admitting: Family Medicine

## 2024-02-09 ENCOUNTER — Encounter: Admitting: Physician Assistant

## 2024-02-09 ENCOUNTER — Ambulatory Visit: Admitting: Physician Assistant

## 2024-02-16 ENCOUNTER — Ambulatory Visit (INDEPENDENT_AMBULATORY_CARE_PROVIDER_SITE_OTHER): Admitting: Physician Assistant

## 2024-02-16 ENCOUNTER — Encounter: Payer: Self-pay | Admitting: Physician Assistant

## 2024-02-16 VITALS — BP 102/70 | HR 64 | Temp 98.9°F | Ht 64.0 in | Wt 179.2 lb

## 2024-02-16 DIAGNOSIS — F17200 Nicotine dependence, unspecified, uncomplicated: Secondary | ICD-10-CM

## 2024-02-16 DIAGNOSIS — G44229 Chronic tension-type headache, not intractable: Secondary | ICD-10-CM | POA: Insufficient documentation

## 2024-02-16 NOTE — Assessment & Plan Note (Signed)
 Discussed likely relationship of tobacco use to some of her other health conditions like her headaches and low HDL.  Encouraged cessation.  She is contemplative.  If she chose to quit, could repeat cold turkey approach as she succeeded with this previously.

## 2024-02-16 NOTE — Progress Notes (Signed)
 "   Date:  02/16/2024   Name:  Wendy Bishop   DOB:  May 14, 1990   MRN:  [id number]   Chief Complaint: Medical Management of Chronic Issues  HPI  Wendy Bishop is a very pleasant 34 year old female with history of tobacco use disorder and tension headaches presenting new to me today as transfer of care from my colleague Dr. Sol who she saw summer 2025.  She does not need anything in particular from me today.  Headaches seem to come in waves of several days at a time, sometimes up to a week.  States she has actually been relatively headache free for a month or 2.  Continues smoking about a pack per day owing to workplace stressors.  Has previously quit cold turkey which lasted for about a year but returned to the habit when her mother was smoking in front of her.  Medication list has been reviewed and updated.  Active Medications[1]   Review of Systems  Patient Active Problem List   Diagnosis Date Noted   Tension headache, chronic 02/16/2024   Class 1 obesity 06/03/2020   Hirsutism 06/03/2020   Tobacco use disorder 04/07/2016    Allergies[2]  Immunization History  Administered Date(s) Administered   HIB (PRP-OMP) 10/18/1990, 03/31/1992   Hepatitis A 04/29/2006   Hepatitis A, Ped/Adol-2 Dose 04/29/2006   Hepatitis B 10/06/2002, 11/23/2002, 05/24/2003   Hepatitis B, PED/ADOLESCENT 10/06/2002, 11/23/2002, 05/24/2003   Hpv-Unspecified 04/29/2006   IPV 10/18/1990, 03/31/1992, 09/28/1994   MMR 09/28/1994, 05/13/2016   Meningococcal Conjugate 04/29/2006   PNEUMOCOCCAL CONJUGATE-20 08/09/2023   PPD Test 06/09/2017, 07/14/2017   Pfizer(Comirnaty)Fall Seasonal Vaccine 12 years and older 10/29/2019, 11/19/2019   Tdap 08/09/2023    Past Surgical History:  Procedure Laterality Date   CESAREAN SECTION     CESAREAN SECTION WITH BILATERAL TUBAL LIGATION N/A 05/11/2016   Procedure: CESAREAN SECTION WITH BILATERAL TUBAL LIGATION;  Surgeon: Wendy JONETTA Mace, MD;  Location: [facility] ORS;   Service: Obstetrics;  Laterality: N/A;  Baby Boy born @ 65 Apgars: 8/9 Weight: 7lb 8oz   CHOLECYSTECTOMY     FRACTURE SURGERY Left 2013   5th toes fracture wire placed    Social History[3]  Family History  Problem Relation Age of Onset   Healthy Mother    Healthy Father    Heart disease Maternal Grandmother    Heart disease Maternal Grandfather         02/16/2024   10:22 AM 07/12/2023    9:17 AM  GAD 7 : Generalized Anxiety Score  Nervous, Anxious, on Edge 0 3  Control/stop worrying 0 3  Worry too much - different things 0 3  Trouble relaxing 0 2  Restless 0 0  Easily annoyed or irritable 0 3  Afraid - awful might happen 0 0  Total GAD 7 Score 0 14  Anxiety Difficulty  Very difficult       02/16/2024   10:22 AM 08/09/2023    9:03 AM 07/12/2023    9:16 AM  Depression screen PHQ 2/9  Decreased Interest 0 0 1  Down, Depressed, Hopeless 0 0 1  PHQ - 2 Score 0 0 2  Altered sleeping   0  Tired, decreased energy   3  Change in appetite   3  Trouble concentrating   0  Moving slowly or fidgety/restless   0  Suicidal thoughts   0  PHQ-9 Score   8   Difficult doing work/chores   Somewhat difficult     Data  saved with a previous flowsheet row definition    BP Readings from Last 3 Encounters:  02/16/24 102/70  12/15/23 119/78  10/27/23 106/73    Wt Readings from Last 3 Encounters:  02/16/24 179 lb 3.2 oz (81.3 kg)  12/15/23 178 lb (80.7 kg)  10/27/23 178 lb (80.7 kg)    BP 102/70   Pulse 64   Temp 98.9 F (37.2 C)   Ht 5' 4 (1.626 m)   Wt 179 lb 3.2 oz (81.3 kg)   LMP 02/15/2024 (Exact Date)   SpO2 99%   BMI 30.76 kg/m   Physical Exam Vitals and nursing note reviewed.  Constitutional:      Appearance: Normal appearance.  Cardiovascular:     Rate and Rhythm: Normal rate and regular rhythm.     Heart sounds: No murmur heard.    No friction rub. No gallop.  Pulmonary:     Effort: Pulmonary effort is normal.     Breath sounds: Decreased breath  sounds (slightly, in bases) present.  Abdominal:     General: There is no distension.  Musculoskeletal:        General: Normal range of motion.  Skin:    General: Skin is warm and dry.  Neurological:     Mental Status: She is alert and oriented to person, place, and time.     Gait: Gait is intact.  Psychiatric:        Mood and Affect: Mood and affect normal.     Recent Labs     Component Value Date/Time   NA 142 07/12/2023 1004   K 4.5 07/12/2023 1004   CL 103 07/12/2023 1004   CO2 24 07/12/2023 1004   GLUCOSE 74 07/12/2023 1004   GLUCOSE 112 (H) 07/29/2020 1627   BUN 6 07/12/2023 1004   CREATININE 0.81 07/12/2023 1004   CALCIUM 9.2 07/12/2023 1004   PROT 6.3 07/12/2023 1004   ALBUMIN 4.0 07/12/2023 1004   AST 10 07/12/2023 1004   ALT 15 07/12/2023 1004   ALKPHOS 89 07/12/2023 1004   BILITOT 0.2 07/12/2023 1004   GFRNONAA >60 07/29/2020 1627    Lab Results  Component Value Date   WBC 7.1 07/12/2023   HGB 12.7 07/12/2023   HCT 39.2 07/12/2023   MCV 99 (H) 07/12/2023   PLT 438 07/12/2023   Lab Results  Component Value Date   HGBA1C 5.2 07/12/2023   Lab Results  Component Value Date   CHOL 134 07/12/2023   HDL 30 (L) 07/12/2023   LDLCALC 85 07/12/2023   TRIG 100 07/12/2023   CHOLHDL 4.5 (H) 07/12/2023   Lab Results  Component Value Date   TSH 1.350 07/12/2023        Assessment & Plan Chronic tension-type headache, not intractable Seems to be reasonably well-controlled at this time, likely stress related, responds fairly well to OTC medications as needed.  Also encouraged heat, massage, neck stretching/yoga.    Tobacco use disorder Discussed likely relationship of tobacco use to some of her other health conditions like her headaches and low HDL.  Encouraged cessation.  She is contemplative.  If she chose to quit, could repeat cold turkey approach as she succeeded with this previously.     Follow-up 5 months CPE, sooner if needed  Rolan Hoyle,  PA-C, DMSc, DipACLM, Nutritionist [facility] [phone]      [1]  Current Meds  Medication Sig   albuterol  (VENTOLIN   HFA) 108 (90 Base) MCG/ACT inhaler Inhale 2 puffs into the lungs every 4 (four) hours as needed.   [DISCONTINUED] azithromycin  (ZITHROMAX  Z-PAK) 250 MG tablet Take 2 tablets on day 1 then 1 tablet daily  [2] No Known Allergies [3]  Social History Tobacco Use   Smoking status: Every Day    Current packs/day: 1.00    Average packs/day: 1 pack/day for 12.0 years (12.0 ttl pk-yrs)    Types: Cigarettes   Smokeless tobacco: Never  Vaping Use   Vaping status: Never Used  Substance Use Topics   Alcohol use: No   Drug use: No   "

## 2024-02-16 NOTE — Assessment & Plan Note (Signed)
 Seems to be reasonably well-controlled at this time, likely stress related, responds fairly well to OTC medications as needed.  Also encouraged heat, massage, neck stretching/yoga.

## 2024-03-29 ENCOUNTER — Ambulatory Visit: Admitting: Obstetrics

## 2024-07-16 ENCOUNTER — Encounter: Admitting: Physician Assistant
